# Patient Record
Sex: Female | Born: 1970 | Race: White | Hispanic: No | Marital: Single | Smoking: Never smoker
Health system: Southern US, Community
[De-identification: ages and names within clinical notes are randomized; demographics above are authoritative.]

## PROBLEM LIST (undated history)

## (undated) DIAGNOSIS — D649 Anemia, unspecified: Secondary | ICD-10-CM

## (undated) DIAGNOSIS — J302 Other seasonal allergic rhinitis: Secondary | ICD-10-CM

## (undated) HISTORY — PX: OTHER SURGICAL HISTORY: SHX169

## (undated) HISTORY — DX: Anemia, unspecified: D64.9

---

## 2002-02-13 HISTORY — PX: THYROID SURGERY: SHX805

## 2017-07-20 ENCOUNTER — Observation Stay (HOSPITAL_COMMUNITY): Payer: BLUE CROSS/BLUE SHIELD

## 2017-07-20 ENCOUNTER — Emergency Department (HOSPITAL_COMMUNITY): Payer: BLUE CROSS/BLUE SHIELD

## 2017-07-20 ENCOUNTER — Other Ambulatory Visit: Payer: Self-pay

## 2017-07-20 ENCOUNTER — Observation Stay (HOSPITAL_COMMUNITY)
Admission: EM | Admit: 2017-07-20 | Discharge: 2017-07-21 | Disposition: A | Discharge status: Home / Self Care | Payer: BLUE CROSS/BLUE SHIELD | Attending: Internal Medicine | Admitting: Internal Medicine

## 2017-07-20 ENCOUNTER — Encounter (HOSPITAL_COMMUNITY): Payer: Self-pay | Admitting: *Deleted

## 2017-07-20 DIAGNOSIS — D649 Anemia, unspecified: Secondary | ICD-10-CM

## 2017-07-20 DIAGNOSIS — Z885 Allergy status to narcotic agent status: Secondary | ICD-10-CM | POA: Diagnosis not present

## 2017-07-20 DIAGNOSIS — N920 Excessive and frequent menstruation with regular cycle: Secondary | ICD-10-CM | POA: Insufficient documentation

## 2017-07-20 DIAGNOSIS — D509 Iron deficiency anemia, unspecified: Secondary | ICD-10-CM | POA: Insufficient documentation

## 2017-07-20 DIAGNOSIS — N800 Endometriosis of uterus: Secondary | ICD-10-CM | POA: Insufficient documentation

## 2017-07-20 DIAGNOSIS — I472 Ventricular tachycardia: Secondary | ICD-10-CM | POA: Insufficient documentation

## 2017-07-20 DIAGNOSIS — R0602 Shortness of breath: Secondary | ICD-10-CM | POA: Insufficient documentation

## 2017-07-20 DIAGNOSIS — Z7901 Long term (current) use of anticoagulants: Secondary | ICD-10-CM | POA: Insufficient documentation

## 2017-07-20 DIAGNOSIS — E611 Iron deficiency: Secondary | ICD-10-CM | POA: Diagnosis present

## 2017-07-20 DIAGNOSIS — I2699 Other pulmonary embolism without acute cor pulmonale: Principal | ICD-10-CM | POA: Insufficient documentation

## 2017-07-20 DIAGNOSIS — Z882 Allergy status to sulfonamides status: Secondary | ICD-10-CM | POA: Insufficient documentation

## 2017-07-20 DIAGNOSIS — Z79899 Other long term (current) drug therapy: Secondary | ICD-10-CM | POA: Diagnosis not present

## 2017-07-20 DIAGNOSIS — R911 Solitary pulmonary nodule: Secondary | ICD-10-CM | POA: Diagnosis present

## 2017-07-20 DIAGNOSIS — R109 Unspecified abdominal pain: Secondary | ICD-10-CM

## 2017-07-20 HISTORY — DX: Other seasonal allergic rhinitis: J30.2

## 2017-07-20 LAB — I-STAT TROPONIN, ED: TROPONIN I, POC: 0.02 ng/mL (ref 0.00–0.08)

## 2017-07-20 LAB — PROTIME-INR
INR: 1.15
PROTHROMBIN TIME: 14.6 s (ref 11.4–15.2)

## 2017-07-20 LAB — BASIC METABOLIC PANEL
Anion gap: 10 (ref 5–15)
BUN: 8 mg/dL (ref 6–20)
CO2: 23 mmol/L (ref 22–32)
Calcium: 8.7 mg/dL — ABNORMAL LOW (ref 8.9–10.3)
Chloride: 104 mmol/L (ref 101–111)
Creatinine, Ser: 1.05 mg/dL — ABNORMAL HIGH (ref 0.44–1.00)
GFR calc Af Amer: >60 mL/min (ref >60)
GFR calc non Af Amer: >60 mL/min (ref >60)
Glucose, Bld: 110 mg/dL — ABNORMAL HIGH (ref 65–99)
Potassium: 4.1 mmol/L (ref 3.5–5.1)
Sodium: 137 mmol/L (ref 135–145)

## 2017-07-20 LAB — CBC
HCT: 17.5 % — ABNORMAL LOW (ref 36.0–46.0)
HCT: 23.8 % — ABNORMAL LOW (ref 36.0–46.0)
HEMOGLOBIN: 7.3 g/dL — AB (ref 12.0–15.0)
Hemoglobin: 5.1 g/dL — CL (ref 12.0–15.0)
MCH: 21.1 pg — ABNORMAL LOW (ref 26.0–34.0)
MCH: 23.3 pg — ABNORMAL LOW (ref 26.0–34.0)
MCHC: 29.1 g/dL — ABNORMAL LOW (ref 30.0–36.0)
MCHC: 30.7 g/dL (ref 30.0–36.0)
MCV: 72.3 fL — AB (ref 78.0–100.0)
MCV: 76 fL — ABNORMAL LOW (ref 78.0–100.0)
PLATELETS: 315 10*3/uL (ref 150–400)
Platelets: 261 10*3/uL (ref 150–400)
RBC: 2.42 MIL/uL — ABNORMAL LOW (ref 3.87–5.11)
RBC: 3.13 MIL/uL — AB (ref 3.87–5.11)
RDW: 17.2 % — AB (ref 11.5–15.5)
RDW: 17.8 % — ABNORMAL HIGH (ref 11.5–15.5)
WBC: 12.6 10*3/uL — AB (ref 4.0–10.5)
WBC: 12.1 10*3/uL — AB (ref 4.0–10.5)

## 2017-07-20 LAB — VITAMIN B12: Vitamin B-12: 900 pg/mL (ref 180–914)

## 2017-07-20 LAB — PREPARE RBC (CROSSMATCH)

## 2017-07-20 LAB — HEPATIC FUNCTION PANEL
ALBUMIN: 3.1 g/dL — AB (ref 3.5–5.0)
ALT: 18 U/L (ref 14–54)
AST: 23 U/L (ref 15–41)
Alkaline Phosphatase: 97 U/L (ref 38–126)
BILIRUBIN TOTAL: 0.5 mg/dL (ref 0.3–1.2)
Bilirubin, Direct: <0.1 mg/dL — ABNORMAL LOW (ref 0.1–0.5)
Total Protein: 7 g/dL (ref 6.5–8.1)

## 2017-07-20 LAB — POC OCCULT BLOOD, ED: Fecal Occult Bld: NEGATIVE

## 2017-07-20 LAB — ABO/RH: ABO/RH(D): A POS

## 2017-07-20 LAB — IRON AND TIBC
IRON: 9 ug/dL — AB (ref 28–170)
Saturation Ratios: 2 % — ABNORMAL LOW (ref 10.4–31.8)
TIBC: 472 ug/dL — AB (ref 250–450)
UIBC: 463 ug/dL

## 2017-07-20 LAB — FERRITIN: FERRITIN: 13 ng/mL (ref 11–307)

## 2017-07-20 LAB — RETICULOCYTES
RBC.: 2.41 MIL/uL — AB (ref 3.87–5.11)
RETIC COUNT ABSOLUTE: 36.2 10*3/uL (ref 19.0–186.0)
Retic Ct Pct: 1.5 % (ref 0.4–3.1)

## 2017-07-20 LAB — HCG, QUANTITATIVE, PREGNANCY: HCG, BETA CHAIN, QUANT, S: 71 m[IU]/mL — AB (ref <5)

## 2017-07-20 LAB — PREGNANCY, URINE: Preg Test, Ur: NEGATIVE

## 2017-07-20 LAB — TSH: TSH: 1.057 u[IU]/mL (ref 0.350–4.500)

## 2017-07-20 LAB — D-DIMER, QUANTITATIVE: D-Dimer, Quant: 5.15 ug/mL-FEU — ABNORMAL HIGH (ref 0.00–0.50)

## 2017-07-20 LAB — FOLATE: Folate: 28.2 ng/mL (ref >5.9)

## 2017-07-20 MED ORDER — SODIUM CHLORIDE 0.9 % IV SOLN: Freq: Once | INTRAVENOUS | Status: DC

## 2017-07-20 MED ORDER — SODIUM CHLORIDE 0.9 % IV SOLN
250.0000 mg | Freq: Once | INTRAVENOUS | Status: AC
  Administered 2017-07-20: 250 mg via INTRAVENOUS
  Filled 2017-07-20: qty 5

## 2017-07-20 MED ORDER — SODIUM CHLORIDE 0.9 % IV BOLUS
1000.0000 mL | Freq: Once | INTRAVENOUS | Status: AC
  Administered 2017-07-20: 1000 mL via INTRAVENOUS

## 2017-07-20 MED ORDER — ACETAMINOPHEN 650 MG RE SUPP: 650.0000 mg | Freq: Four times a day (QID) | RECTAL | Status: DC | PRN

## 2017-07-20 MED ORDER — IOPAMIDOL (ISOVUE-370) INJECTION 76%
INTRAVENOUS | Status: AC
  Administered 2017-07-20: 80 mL via INTRAVENOUS
  Filled 2017-07-20: qty 100

## 2017-07-20 MED ORDER — LORATADINE 10 MG PO TABS
10.0000 mg | ORAL_TABLET | Freq: Every day | ORAL | Status: DC
  Administered 2017-07-21: 10 mg via ORAL
  Filled 2017-07-20: qty 1

## 2017-07-20 MED ORDER — ALPRAZOLAM 0.25 MG PO TABS
0.2500 mg | ORAL_TABLET | Freq: Three times a day (TID) | ORAL | Status: DC | PRN
  Administered 2017-07-21: 0.25 mg via ORAL
  Filled 2017-07-20: qty 1

## 2017-07-20 MED ORDER — LORAZEPAM 2 MG/ML IJ SOLN
0.5000 mg | Freq: Once | INTRAMUSCULAR | Status: AC
  Administered 2017-07-20: 0.5 mg via INTRAVENOUS
  Filled 2017-07-20: qty 1

## 2017-07-20 MED ORDER — SODIUM CHLORIDE 0.9 % IV SOLN
Freq: Once | INTRAVENOUS | Status: AC
  Administered 2017-07-20: 18:00:00 via INTRAVENOUS

## 2017-07-20 MED ORDER — SODIUM CHLORIDE 0.9% FLUSH
3.0000 mL | Freq: Two times a day (BID) | INTRAVENOUS | Status: DC
  Administered 2017-07-20 – 2017-07-21 (×2): 3 mL via INTRAVENOUS

## 2017-07-20 MED ORDER — ENOXAPARIN SODIUM 80 MG/0.8ML ~~LOC~~ SOLN
80.0000 mg | Freq: Two times a day (BID) | SUBCUTANEOUS | Status: DC
  Administered 2017-07-20 – 2017-07-21 (×4): 80 mg via SUBCUTANEOUS
  Filled 2017-07-20 (×5): qty 0.8

## 2017-07-20 MED ORDER — ONDANSETRON HCL 4 MG PO TABS: 4.0000 mg | ORAL_TABLET | Freq: Four times a day (QID) | ORAL | Status: DC | PRN

## 2017-07-20 MED ORDER — ONDANSETRON HCL 4 MG/2ML IJ SOLN: 4.0000 mg | Freq: Four times a day (QID) | INTRAMUSCULAR | Status: DC | PRN

## 2017-07-20 MED ORDER — GADOBENATE DIMEGLUMINE 529 MG/ML IV SOLN
18.0000 mL | Freq: Once | INTRAVENOUS | Status: AC
  Administered 2017-07-20: 18 mL via INTRAVENOUS

## 2017-07-20 MED ORDER — IRON DEXTRAN 50 MG/ML IJ SOLN
25.0000 mg | Freq: Once | INTRAMUSCULAR | Status: AC
  Administered 2017-07-20: 25 mg via INTRAVENOUS
  Filled 2017-07-20: qty 0.5

## 2017-07-20 MED ORDER — POLYSACCHARIDE IRON COMPLEX 150 MG PO CAPS: 150.0000 mg | ORAL_CAPSULE | Freq: Every day | ORAL | Status: DC

## 2017-07-20 MED ORDER — POLYSACCHARIDE IRON COMPLEX 150 MG PO CAPS
150.0000 mg | ORAL_CAPSULE | Freq: Every day | ORAL | Status: DC
  Administered 2017-07-21: 150 mg via ORAL
  Filled 2017-07-20: qty 1

## 2017-07-20 MED ORDER — SODIUM CHLORIDE 0.9 % IV SOLN
10.0000 mL/h | Freq: Once | INTRAVENOUS | Status: AC
  Administered 2017-07-20: 10 mL/h via INTRAVENOUS

## 2017-07-20 MED ORDER — ACETAMINOPHEN 325 MG PO TABS
650.0000 mg | ORAL_TABLET | Freq: Four times a day (QID) | ORAL | Status: DC | PRN
  Administered 2017-07-21: 650 mg via ORAL
  Filled 2017-07-20: qty 2

## 2017-07-20 NOTE — ED Notes (Signed)
Patient transported to Ultrasound 

## 2017-07-20 NOTE — ED Provider Notes (Signed)
[facility] EMERGENCY DEPARTMENT Provider Note   CSN: [id number] Arrival date & time: 07/20/17  0117     History   Chief Complaint Chief Complaint  Patient presents with  . Shortness of Breath  . Chest Pain    HPI Brianna Wiley is a 47 y.o. female.  47 year old female presents to the emergency department for evaluation of shortness of breath.  Patient states that she has had shortness of breath for the past 10 days.  She noticed symptoms after flying to [place] with her mother.  Shortness of breath was worsened by exertion and prolonged speech.  Patient also felt increasingly fatigued.  She would experience palpitations with exertion or walking.  The patient initially attributed her symptoms to poor altitude adjustment.  She had a dry, nonproductive cough and noted some preceding upper respiratory-type symptoms before her travels.  She was evaluated at urgent care and diagnosed with bronchitis.  The patient took a 5-day course of prednisone as well as Augmentin.  This provided no relief to her dyspnea.  She proceeded to drive cross[place]country with her mother and, given worsening and persistent symptoms upon returning home, decided to seek evaluation in the ED.  She denies any known fevers, melena, hematochezia, increased bleeding or bruising.  She is not on chronic anticoagulation.  No history of prior transfusions for anemia.  She does have a history of menorrhagia, though this has never been problematic.  No associated abdominal pain.  The history is provided by the patient and a relative. No language interpreter was used.    History reviewed. No pertinent past medical history.  There are no active problems to display for this patient.   History reviewed. No pertinent surgical history.   OB History   None      Home Medications    Prior to Admission medications   Medication Sig Start Date End Date Taking? Authorizing Provider  cetirizine (ZYRTEC) 10 MG tablet  Take 10 mg by mouth every morning.   Yes [provider]    Family History No family history on file.  Social History Social History   Tobacco Use  . Smoking status: Never Smoker  . Smokeless tobacco: Never Used  Substance Use Topics  . Alcohol use: Never    Frequency: Never  . Drug use: Never     Allergies   Codeine and Sulfa antibiotics   Review of Systems Review of Systems Ten systems reviewed and are negative for acute change, except as noted in the HPI.    Physical Exam Updated Vital Signs BP 140/85   Pulse (!) 104   Temp 98.1 F (36.7 C)   Resp (!) 27   Ht 5\' 6"  (1.676 m)   Wt 81.6 kg (180 lb)   LMP 07/04/2017   SpO2 100%   BMI 29.05 kg/m   Physical Exam  Constitutional: She is oriented to person, place, and time. She appears well-developed and well-nourished. No distress.  Pleasant, nontoxic.  HENT:  Head: Normocephalic and atraumatic.  Eyes: EOM are normal. No scleral icterus.  Pale conjunctiva  Neck: Normal range of motion.  Cardiovascular: Regular rhythm and intact distal pulses.  Tachycardia  Pulmonary/Chest: Effort normal. No stridor. No respiratory distress. She has no wheezes. She has no rales.  Lungs CTAB. Dyspneic with prolonged speech. SpO2 96-98% on room air.  Genitourinary:  Genitourinary Comments: Normal rectal tone. No melena or BRBPR on chaperoned DRE.  Musculoskeletal: Normal range of motion.  Neurological: She is alert and  oriented to person, place, and time. She exhibits normal muscle tone. Coordination normal.  GCS 15. Patient moving all extremities.  Skin: Skin is warm and dry. No rash noted. She is not diaphoretic. No erythema. No pallor.  Psychiatric: She has a normal mood and affect. Her behavior is normal.  Nursing note and vitals reviewed.    ED Treatments / Results  Labs (all labs ordered are listed, but only abnormal results are displayed) Labs Reviewed  BASIC METABOLIC PANEL - Abnormal; Notable for the  following components:      Result Value   Glucose, Bld 110 (*)    Creatinine, Ser 1.05 (*)    Calcium 8.7 (*)    All other components within normal limits  CBC - Abnormal; Notable for the following components:   WBC 12.6 (*)    RBC 2.42 (*)    Hemoglobin 5.1 (*)    HCT 17.5 (*)    MCV 72.3 (*)    MCH 21.1 (*)    MCHC 29.1 (*)    RDW 17.2 (*)    All other components within normal limits  HCG, QUANTITATIVE, PREGNANCY - Abnormal; Notable for the following components:   hCG, Beta Chain, Quant, S 71 (*)    All other components within normal limits  D-DIMER, QUANTITATIVE (NOT AT [facility]) - Abnormal; Notable for the following components:   D-Dimer, Quant 5.15 (*)    All other components within normal limits  IRON AND TIBC - Abnormal; Notable for the following components:   Iron 9 (*)    TIBC 472 (*)    Saturation Ratios 2 (*)    All other components within normal limits  RETICULOCYTES - Abnormal; Notable for the following components:   RBC. 2.41 (*)    All other components within normal limits  PROTIME-INR  PREGNANCY, URINE  VITAMIN B12  FERRITIN  FOLATE  I-STAT TROPONIN, ED  POC OCCULT BLOOD, ED  TYPE AND SCREEN  ABO/RH  PREPARE RBC (CROSSMATCH)    EKG None  Radiology Dg Chest 2 View  Result Date: 07/20/2017 CLINICAL DATA:  Acute onset of generalized chest pain and shortness of breath. EXAM: CHEST - 2 VIEW COMPARISON:  None. FINDINGS: The lungs are well-aerated and clear. There is no evidence of focal opacification, pleural effusion or pneumothorax. The heart is borderline normal in size. No acute osseous abnormalities are seen. IMPRESSION: No acute cardiopulmonary process seen. Electronically Signed   By: Garald Balding M.D.   On: 07/20/2017 02:43    Procedures Procedures (including critical care time)  Medications Ordered in ED Medications  0.9 %  sodium chloride infusion (has no administration in time range)  sodium chloride 0.9 % bolus 1,000 mL (0 mLs Intravenous  Stopped 07/20/17 0329)    CRITICAL CARE Performed by: Antonietta Breach   Total critical care time: 45 minutes  Critical care time was exclusive of separately billable procedures and treating other patients.  Critical care was necessary to treat or prevent imminent or life-threatening deterioration.  Critical care was time spent personally by me on the following activities: development of treatment plan with patient and/or surrogate as well as nursing, discussions with consultants, evaluation of patient's response to treatment, examination of patient, obtaining history from patient or surrogate, ordering and performing treatments and interventions, ordering and review of laboratory studies, ordering and review of radiographic studies, pulse oximetry and re-evaluation of patient's condition.   Initial Impression / Assessment and Plan / ED Course  I have reviewed the triage vital  signs and the nursing notes.  Pertinent labs & imaging results that were available during my care of the patient were reviewed by me and considered in my medical decision making (see chart for details).     47 year old female presents to the emergency department for palpitations and dyspnea on exertion.  Her symptoms are likely secondary to symptomatic anemia given hemoglobin of 5.1.  Low MCV favors chronic anemia.  She is hemoccult negative and denies any melena or hematochezia.  No increased bruising or bleeding.  She does report a history of menorrhagia, but denies any prior issues with anemia secondary to this.  A d-dimer was ordered in triage, found to be 5.15.  While PE remains low on differential given that anemia fits clinical picture, will continue with CTA.  Discussed risks of radiation with the patient who verbalizes understanding.  She is agreeable to proceed with CT scan.  Patient to be admitted by [facility] hospitalist service.  She has been ordered to receive 2 units of PRBCs.   Final Clinical Impressions(s) /  ED Diagnoses   Final diagnoses:  Symptomatic anemia    ED Discharge Orders    None       Antonietta Breach, PA-C 07/20/17 1324    Veryl Speak, MD 07/20/17 [phone]

## 2017-07-20 NOTE — Progress Notes (Addendum)
Transvaginal/pelvic ultrasound revealed heterogeneous echotexture throughout the uterus which can be seen with adenomyosis.  There was also a large complex cystic mass within the right ovary measuring up to 7.9 cm with mild mural nodularity.  If not surgically excised this could be further characterized by MRI.  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  Discussed with patient.  No prior history of ovarian cysts.  Discussed need with patient to obtain an MRI to better characterize this lesion. MR pelvis ordered.  1647: Post transfusion Hgb up to 7.3- will transfuse 2 more units PRBCs and rpt CBC post transfusion. MR pelvis in process.  1705: With patient's permission mother and sister at bedside updated on MR results which are highly suspicious for ovarian malignancy.  Patient and family aware that GYN consultation indicated but likely would not take place until tomorrow on 6/8.  Patient and family verbalized understanding of MR results and need for formal gynecological evaluation and likely eventual surgical intervention.  1722: Discussed with on-call GYN Dr. Elonda Husky.  Given patient's high acuity and presentation with bilateral PE and symptomatic anemia with a new diagnosis of ovarian cancer patient would best be served by transferring to tertiary facility.  [facility] oncology GYN was recommended.  Will leave signout for oncoming daytime physician and recommend they contact [facility] and/or other tertiary GYN oncology service.   Erin Hearing, ANP

## 2017-07-20 NOTE — Progress Notes (Signed)
ANTICOAGULATION CONSULT NOTE - Initial Consult  Pharmacy Consult for lovenox Indication: pulmonary embolus  Allergies  Allergen Reactions  . Codeine Anaphylaxis  . Sulfa Antibiotics Hives    Patient Measurements: Height: 5\' 6"  (167.6 cm) Weight: 180 lb (81.6 kg) IBW/kg (Calculated) : 59.3  Vital Signs: Temp: 99.6 F (37.6 C) (06/07 0824) Temp Source: Oral (06/07 0824) BP: 143/83 (06/07 0800) Pulse Rate: 100 (06/07 0824)  Labs: Recent Labs    07/20/17 0201 07/20/17 0252  HGB 5.1*  --   HCT 17.5*  --   PLT 315  --   LABPROT  --  14.6  INR  --  1.15  CREATININE 1.05*  --     Estimated Creatinine Clearance: 71.3 mL/min (A) (by C-G formula based on SCr of 1.05 mg/dL (H)).  Assessment: 8 yof presented to the ED with SOB and CP after recent travel. Found to have a bilateral PE without right heart strain. To start lovenox treatment. Of note, pt with significant anemia with Hgb of 5.1. Discussed with provider that pt has no bleeding. Iron level is low. Transfusing patient and will require close monitoring.   Goal of Therapy:  Anti-Xa level 0.6-1 units/ml 4hrs after LMWH dose given Monitor platelets by anticoagulation protocol: Yes   Plan:  Lovenox 80mg  SQ Q12H F/u CBC Monitor renal function and signs/symptoms of bleeding F/u transition to oral anticoagulation>>likely NOAC  Brianca Fortenberry, Rande Lawman 07/20/2017,8:25 AM

## 2017-07-20 NOTE — ED Notes (Signed)
Pt back from MRI. Tech bringing her upstairs to new room.

## 2017-07-20 NOTE — Care Management (Signed)
Benefits check in for:  1. XARELTO 15 MG BID  2. XARELTO 20 MG DAILY  3. ELIQUIS 5 MG BID  4. ELIQUIS 2.5 MG BID   Will update with results as it becomes available.

## 2017-07-20 NOTE — ED Notes (Signed)
Pt remains in MRI 

## 2017-07-20 NOTE — Plan of Care (Signed)
Discussed with Antonietta Breach, PA-C  Brianna Wiley is a 47 year old female who presented by EMS for chest pain and SOB for several days after recent trip to [place] 10 days ago.  VS: Temperature afebrile, pulse 99-1 10, respiration 20-35, blood pressure stable, and O2 saturation maintained on room air.  Labs: WBC 12.6, hemoglobin 5.1( with low MCV and MCH),creatinine 1.5, troponin negative, d-dimer 5.15.  Stool guaiac negative.  Chest x-ray was otherwise clear.  Beta-hCG was noted to be positive, but urine pregnancy screen was negative.  CT angiogram of the chest is pending.  Patient was typed and screened for blood products in order to be transfused 2 units of packed red blood cells.  TRH called to admit.  Accepted to a telemetry bed.

## 2017-07-20 NOTE — H&P (Addendum)
Patient is currently on room air, vital signs are stable. She has anemia (hgb 5), no acute bleed currently, she has bilateral PE. She is getting prbc transfusion, started on anticoagulation. Echo/venous doppler/transvagianl US ordered. She is advised to follow up with gyn and pcp closely.  I have directly reviewed the clinical findings, lab results and imaging studies. I have interviewed and examined the patient and agree with the documentation and management as recorded by the Physician extender.  Florencia Reasons M.D PhD on 07/20/2017 at 9:59 AM  [facility] Office  [phone]      History and Physical    Brianna Wiley BJY:[id number] DOB: 1970-05-24 DOA: 07/20/2017  **Will admit patient based on the expectation that the patient will need hospitalization/ hospital care that crosses at least 2 midnights  PCP: [facility]   Attending physician: Florencia Reasons  Patient coming from/Resides with: Private residence  Chief Complaint: Shortness of breath and palpitations  HPI: Brianna Wiley is a 47 y.o. female with medical history significant for seasonal allergies and remote resection of left benign thigh nodule arm nodule.  Patient reports that in March she had "flu" and was treated with prednisone and antibiotics.  She recovered from that.  No extensive bed rest.  Several weeks before a recent trip to [place] she felt like she had cold-like symptoms although had a dry nonproductive cough and no fevers or chills.  She developed more progressive shortness of breath several days before the trip but did not feel the symptoms are significant enough to cancel her trip.  While in [place] she developed tachypalpitations and worsening dyspnea on exertion and fatigue.  Resented to a local urgent care and was diagnosed with bronchitis and altitude sickness.  She was treated once again with antibiotics and prednisone without any significant resolution in her symptoms.   Initial travel to [facility] involved flying by aircraft 10 days ago.  Upon presentation to the ER the patient was afebrile, mildly tachycardic but normotensive.  She was found to have a hemoglobin of 5.1 with low MCV.  Patient does report history of heavy periods for the first day and a half of her cycles which is a chronic occurrence.  No GI bleeding symptoms.  EDP has treated as symptomatic anemia and has started 2 units of packed red blood cells.  Subsequent anemia panel positive for significantly low iron at 9.  Because of her history of travel and presenting symptoms a d-dimer was obtained in triage and was found to be elevated at 5.15.  After the patient was handed off to [facility] team for admission CT results returned and were positive for bilateral PE.  On exam patient does not have any significant lower extremity edema although right calf appears slightly larger than left calf.  Continues to have a dry nonproductive cough, tachycardia and shortness of breath with exertion.  ED Course:  Vital Signs: BP (!) 146/70   Pulse 98   Temp 98.9 F (37.2 C) (Oral)   Resp (!) 23   Ht 5\' 6"  (1.676 m)   Wt 81.6 kg (180 lb)   LMP 07/04/2017   SpO2 96%   BMI 29.05 kg/m  CXR: Neg CTA chest: Bilateral pulmonary emboli is somewhat more extensive on the right than the left but extending into multiple upper and lower lobe pulmonary arterial vessels bilaterally.  Incidental finding of a 7 x 5 mm nodular opacity in the apical segment of the right upper lobe with a noncontrast  CT recommended at 6 to 12 months Lab data: Sodium 137, potassium 4.1, chloride 104, CO2 23, glucose 110, BUN 8, creatinine 1.05, anion gap 10, troponin normal, LFTs normal including normal total bilirubin, iron 9, saturation 2%, U IBC 463, TIBC 472, ferritin 13, folate 28.2, vitamin B12 900, hCG beta 71 but urine pregnancy negative, white count 12,600 differential not obtained, MCV 72.3, hemoglobin 5.1, platelets 315,000, d-dimer 5.15,  coags normal FOB negative Medications and treatments: NS bolus x1 L  Review of Systems:  In addition to the HPI above,  No Fever-chills, myalgias or other constitutional symptoms No Headache, changes with Vision or hearing, new focal weakness, tingling, numbness in any extremity, dizziness, dysarthria or word finding difficulty, gait disturbance or imbalance, tremors or seizure activity No problems swallowing food or Liquids, indigestion/reflux, choking or coughing while eating, abdominal pain with or after eating No Abdominal pain, N/V, melena,hematochezia, dark tarry stools, constipation No dysuria, malodorous urine, hematuria or flank pain No new skin rashes, lesions, masses or bruises, No new joint pains, aches, swelling or redness No recent unintentional weight gain or loss No polyuria, polydypsia or polyphagia   Past Medical History:  Diagnosis Date  . Seasonal allergies     Past Surgical History:  Procedure Laterality Date  . THYROID SURGERY Left 2004   removal of benign nodule    Social History   Socioeconomic History  . Marital status: Single    Spouse name: Not on file  . Number of children: Not on file  . Years of education: Not on file  . Highest education level: Not on file  Occupational History  . Not on file  Social Needs  . Financial resource strain: Not on file  . Food insecurity:    Worry: Not on file    Inability: Not on file  . Transportation needs:    Medical: Not on file    Non-medical: Not on file  Tobacco Use  . Smoking status: Never Smoker  . Smokeless tobacco: Never Used  Substance and Sexual Activity  . Alcohol use: Never    Frequency: Never  . Drug use: Never  . Sexual activity: Not on file  Lifestyle  . Physical activity:    Days per week: Not on file    Minutes per session: Not on file  . Stress: Not on file  Relationships  . Social connections:    Talks on phone: Not on file    Gets together: Not on file    Attends religious  service: Not on file    Active member of club or organization: Not on file    Attends meetings of clubs or organizations: Not on file    Relationship status: Not on file  . Intimate partner violence:    Fear of current or ex partner: Not on file    Emotionally abused: Not on file    Physically abused: Not on file    Forced sexual activity: Not on file  Other Topics Concern  . Not on file  Social History Narrative  . Not on file    Mobility: Independent Work history: Works two part-time jobs.  Job #1 is at [employer] and is essentially sedentary.  Job #2 is at [employer] and involves walking and standing on her feet 100% of the job   Allergies  Allergen Reactions  . Codeine Anaphylaxis  . Sulfa Antibiotics Hives    Family History  Problem Relation Age of Onset  . Iron deficiency Mother   .  Anemia Sister   . Iron deficiency Sister   . Goiter Sister      Prior to Admission medications   Medication Sig Start Date End Date Taking? Authorizing Provider  cetirizine (ZYRTEC) 10 MG tablet Take 10 mg by mouth every morning.   Yes [provider]    Physical Exam: Vitals:   07/20/17 0824 07/20/17 0850 07/20/17 0855 07/20/17 0912  BP:  (!) 147/76 (!) 141/64 (!) 146/70  Pulse: 100 (!) 102 (!) 103 98  Resp: (!) 24 (!) 24 (!) 26 (!) 23  Temp: 99.6 F (37.6 C) 99.6 F (37.6 C) 99.2 F (37.3 C) 98.9 F (37.2 C)  TempSrc: Oral Oral Oral Oral  SpO2: 96% 96% 96% 96%  Weight:      Height:          Constitutional: NAD, calm, comfortable-overall pale in appearance Eyes: PERRL, lids and sclera are normal in appearance, bilateral conjunctivae pale ENMT: Mucous membranes are moist. Posterior pharynx clear of any exudate or lesions.Normal dentition.  Neck: normal, supple, no masses, no thyromegaly or nodules Respiratory: clear to auscultation bilaterally, no wheezing, no crackles. Normal respiratory effort. No accessory muscle use.  Dry nonproductive cough  witnessed. Cardiovascular: Regular rhythm with subtle resting tachycardia ventricular rate 10 2-1 05, no murmurs / rubs / gallops. No pronounced lower extremity edema the right calf appears subtly more enlarged than left. 2+ pedal pulses. No carotid bruits.  Abdomen: no tenderness, no masses palpated. No hepatosplenomegaly. Bowel sounds positive.  Musculoskeletal: no clubbing / cyanosis. No joint deformity upper and lower extremities. Good ROM, no contractures. Normal muscle tone.  Skin: no rashes, lesions, ulcers. No induration Neurologic: CN 2-12 grossly intact. Sensation intact, DTR normal. Strength 5/5 x all 4 extremities.  Psychiatric: Normal judgment and insight. Alert and oriented x 3. Normal mood.    Labs on Admission: I have personally reviewed following labs and imaging studies  CBC: Recent Labs  Lab 07/20/17 0201  WBC 12.6*  HGB 5.1*  HCT 17.5*  MCV 72.3*  PLT 101   Basic Metabolic Panel: Recent Labs  Lab 07/20/17 0201  NA 137  K 4.1  CL 104  CO2 23  GLUCOSE 110*  BUN 8  CREATININE 1.05*  CALCIUM 8.7*   GFR: Estimated Creatinine Clearance: 71.3 mL/min (A) (by C-G formula based on SCr of 1.05 mg/dL (H)). Liver Function Tests: Recent Labs  Lab 07/20/17 0750  AST 23  ALT 18  ALKPHOS 97  BILITOT 0.5  PROT 7.0  ALBUMIN 3.1*   No results for input(s): LIPASE, AMYLASE in the last 168 hours. No results for input(s): AMMONIA in the last 168 hours. Coagulation Profile: Recent Labs  Lab 07/20/17 0252  INR 1.15   Cardiac Enzymes: No results for input(s): CKTOTAL, CKMB, CKMBINDEX, TROPONINI in the last 168 hours. BNP (last 3 results) No results for input(s): PROBNP in the last 8760 hours. HbA1C: No results for input(s): HGBA1C in the last 72 hours. CBG: No results for input(s): GLUCAP in the last 168 hours. Lipid Profile: No results for input(s): CHOL, HDL, LDLCALC, TRIG, CHOLHDL, LDLDIRECT in the last 72 hours. Thyroid Function Tests: No results for  input(s): TSH, T4TOTAL, FREET4, T3FREE, THYROIDAB in the last 72 hours. Anemia Panel: Recent Labs    07/20/17 0400  VITAMINB12 900  FOLATE 28.2  FERRITIN 13  TIBC 472*  IRON 9*  RETICCTPCT 1.5   Urine analysis: No results found for: COLORURINE, APPEARANCEUR, LABSPEC, PHURINE, GLUCOSEU, HGBUR, BILIRUBINUR, KETONESUR, PROTEINUR, UROBILINOGEN, NITRITE,  LEUKOCYTESUR Sepsis Labs: @LABRCNTIP (procalcitonin:4,lacticidven:4) )No results found for this or any previous visit (from the past 240 hour(s)).   Radiological Exams on Admission: Dg Chest 2 View  Result Date: 07/20/2017 CLINICAL DATA:  Acute onset of generalized chest pain and shortness of breath. EXAM: CHEST - 2 VIEW COMPARISON:  None. FINDINGS: The lungs are well-aerated and clear. There is no evidence of focal opacification, pleural effusion or pneumothorax. The heart is borderline normal in size. No acute osseous abnormalities are seen. IMPRESSION: No acute cardiopulmonary process seen. Electronically Signed   By: Garald Balding M.D.   On: 07/20/2017 02:43   Ct Angio Chest Pe W And/or Wo Contrast  Result Date: 07/20/2017 CLINICAL DATA:  Shortness of breath EXAM: CT ANGIOGRAPHY CHEST WITH CONTRAST TECHNIQUE: Multidetector CT imaging of the chest was performed using the standard protocol during bolus administration of intravenous contrast. Multiplanar CT image reconstructions and MIPs were obtained to evaluate the vascular anatomy. CONTRAST:  80 mL ISOVUE-370 IOPAMIDOL (ISOVUE-370) INJECTION 76% COMPARISON:  Chest radiograph July 20, 2017 FINDINGS: Cardiovascular: There are pulmonary emboli arising from the distal most aspect of the right main pulmonary artery extending into multiple right upper and lower lobe branches. There also pulmonary emboli in several left upper and lower lobe pulmonary artery branches. The right ventricle to left ventricle diameter ratio is less than 0.9, not consistent with right heart strain. There is no thoracic  aortic aneurysm or dissection. The visualized great vessels appear normal. There is no pericardial effusion or pericardial thickening. Mediastinum/Nodes: Thyroid appears unremarkable. There is no appreciable thoracic adenopathy. No esophageal lesions are evident. Lungs/Pleura: There is mild atelectatic change in the left base. No pulmonary infarcts are evident. No consolidation noted. In the apical segment of the right upper lobe, there is a nodular opacity measuring 7 x 5 mm, best seen on axial slice 24 series 6. There is no pleural effusion or pleural thickening evident. Upper Abdomen: Visualized upper abdominal structures appear normal. Musculoskeletal: There are no blastic or lytic bone lesions. No chest wall lesions evident. Review of the MIP images confirms the above findings. IMPRESSION: 1. Bilateral pulmonary emboli, somewhat or extensive on the right than on the left but extending into multiple upper and lower lobe pulmonary arterial vessels bilaterally. No demonstrable right heart strain. 2.  No thoracic aortic aneurysm or dissection. 3. 7 x 5 mm nodular opacity in the apical segment right upper lobe. Non-contrast chest CT at 6-12 months is recommended. If the nodule is stable at time of repeat CT, then future CT at 18-24 months (from today's scan) is considered optional for low-risk patients, but is recommended for high-risk patients. This recommendation follows the consensus statement: Guidelines for Management of Incidental Pulmonary Nodules Detected on CT Images: From the Fleischner Society 2017; Radiology 2017; 284:228-243. 4.  No lung edema or consolidation.  Slight atelectasis left base. 5.  No appreciable thoracic adenopathy. Critical Value/emergent results were called by telephone at the time of interpretation on 07/20/2017 at 7:54 am to Dr. Jola Schmidt , who verbally acknowledged these results. Electronically Signed   By: Lowella Grip III M.D.   On: 07/20/2017 07:54    EKG: (Independently  reviewed) sinus tachycardia ventricular rate 105 bpm, QTC 459 ms, nonspecific ST segment downsloping in leads II and aVF, normal R wave rotation, no acute ischemic changes  Assessment/Plan Principal Problem:   Bilateral pulmonary embolism  -Patient presents with progressive dyspnea, palpitations and dry nonproductive cough that began several weeks before plane travel to [place],  [place] but worsened after arrival and has persisted since return to [place].  D-dimer elevated and CTA chest consistent with bilateral PE -History somewhat clouded by concomitant issues related to what appears to be progressive yet undetected iron deficiency anemia which could also cause similar symptoms -Not on hormonal agents prior to admission, non-smoker -Suspect provoked PE in context of recent plane travel although symptoms could have preceded plane travel and were precipitated by recent influenza illness with dehydration in March 2019 -Begin anticoagulation with Lovenox full dose every 12 hours-pharmacy assisting -Patient continues to have periods with LMP 2 weeks ago therefore will need to be watched closely and follow-up with GYN after discharge -Patient has associated iron deficiency anemia which likely will require dietary adjustments to include leafy green vegetables which also are high in vitamin K therefore warfarin likely not best long-term A/C choice -Pharmacist to discuss A/C choice with patient but leaning towards NOAC -CM to clarify co-pay on NOAC's -Anticipate 6 months therapy -Check hypercoagulable panel only if has recurrent DVT or PE -CTA without evidence of right heart strain but for completeness of evaluation will obtain echocardiogram-initial troponin was normal -Bilateral LE duplex  Active Problems:   Symptomatic anemia/Iron deficiency -Patient has long-standing history of heavy menses during her first 1-1/2 days which has been ongoing for multiple years and has not changed -No signs  or symptoms of GI bleeding -Patient denies perimenopausal symptoms such as stuttering or inconsistent menses -Does not take birth control pills/hormones -Iron level quite low at 9 with a saturation of 2% therefore we will give a test dose of IV iron followed by 250 mg of iron then begin oral iron on 6/8 -Recommend follow-up with PCP for follow-up anemia panel in 6 weeks -No baseline Hgb for comparison but current hemoglobin quite low at 5.1.  Given relative hemodynamic stability with no signs of active bleeding suspect patient has had chronic progressive anemia in context of evolving/severe iron deficiency anemia -Obtain TSH -Given age will obtain transvaginal ultrasound to determine if has fibroids that could be contributing to initially heavy menses cycle    Incidental pulmonary nodule  -Noted on CTA of chest -Is non-smoker and lesion is subcentimeter but will at least need one additional study in 12 months recommended by radiology  **Additional lab, imaging and/or diagnostic evaluation at discretion of supervising physician  DVT prophylaxis: Full dose Lovenox Code Status: Full Family Communication: Mother Disposition Plan: Home Consults called: None    ELLIS,ALLISON L. ANP-BC [facility] Pager [phone]   If 7PM-7AM, please contact night-coverage [email or web address] Password [facility]  07/20/2017, 9:13 AM

## 2017-07-20 NOTE — ED Notes (Signed)
Meal tray delivered and at bedside.

## 2017-07-20 NOTE — ED Notes (Signed)
ED Provider at bedside. 

## 2017-07-20 NOTE — ED Triage Notes (Signed)
Pt arrived by EMS for chest pain and sob for the past several days. 10 days ago pt flew to [place], started having sob, went to UC there and was diagnosed with bronchitis due to altitude. From [place], pt drove back to [facility]. Pt reports worsening exertional sob with "heart pounding" when ambulating. Also has had a dry cough

## 2017-07-20 NOTE — Care Management (Signed)
Benefits check results:  S/W MINDY @ [facility] # [phone]    1. XARELTO 15 MG BID  COVER- YES  CO-PAY- $ 325.89  TIER- 3 DRUG  PRIOR APPROVAL- NO    2. XARELTO 20 MG DAILY  COVER- YES  CO-PAY- $ 325.89  TIER- 3 DRUG  PRIOR APPROVAL- NO    3. ELIQUIS  2.5 MG BID  COVER- YES  CO-PAY- $ 325.89  TIER- 3 DRUG  PRIOR APPROVAL- NO    4. ELIQUIS 5 MG BID  COVER- YES  CO-PAY- $ 325.89  TIER- 3 DRUG  PRIOR APPROVAL- NO    DEDUCTIBLE : NOT MET  OUT OF POCKET- NOT MET  AFTER THE DEDUCTIBLE HAS BEEN MET CO-PAY- $ 40.00    PREFERRED PHARMACY : YES [facility]

## 2017-07-21 ENCOUNTER — Observation Stay (HOSPITAL_COMMUNITY): Payer: BLUE CROSS/BLUE SHIELD

## 2017-07-21 DIAGNOSIS — I2699 Other pulmonary embolism without acute cor pulmonale: Secondary | ICD-10-CM | POA: Diagnosis not present

## 2017-07-21 LAB — TYPE AND SCREEN
ABO/RH(D): A POS
Antibody Screen: NEGATIVE
UNIT DIVISION: 0
Unit division: 0
Unit division: 0
Unit division: 0

## 2017-07-21 LAB — BASIC METABOLIC PANEL
ANION GAP: 10 (ref 5–15)
BUN: 6 mg/dL (ref 6–20)
CALCIUM: 8.5 mg/dL — AB (ref 8.9–10.3)
CO2: 22 mmol/L (ref 22–32)
Chloride: 106 mmol/L (ref 101–111)
Creatinine, Ser: 0.91 mg/dL (ref 0.44–1.00)
Glucose, Bld: 96 mg/dL (ref 65–99)
Potassium: 4 mmol/L (ref 3.5–5.1)
SODIUM: 138 mmol/L (ref 135–145)

## 2017-07-21 LAB — BPAM RBC
BLOOD PRODUCT EXPIRATION DATE: 201907012359
Blood Product Expiration Date: 201906302359
Blood Product Expiration Date: 201906302359
Blood Product Expiration Date: 201907012359
ISSUE DATE / TIME: 201906070850
ISSUE DATE / TIME: 201906072327
ISSUE DATE / TIME: 201906070505
ISSUE DATE / TIME: 201906071727
UNIT TYPE AND RH: 6200
UNIT TYPE AND RH: 6200
Unit Type and Rh: 6200
Unit Type and Rh: 6200

## 2017-07-21 LAB — CBC
HCT: 29.5 % — ABNORMAL LOW (ref 36.0–46.0)
Hemoglobin: 9.4 g/dL — ABNORMAL LOW (ref 12.0–15.0)
MCH: 24.7 pg — ABNORMAL LOW (ref 26.0–34.0)
MCHC: 31.9 g/dL (ref 30.0–36.0)
MCV: 77.6 fL — ABNORMAL LOW (ref 78.0–100.0)
Platelets: 273 10*3/uL (ref 150–400)
RBC: 3.8 MIL/uL — ABNORMAL LOW (ref 3.87–5.11)
RDW: 17.9 % — AB (ref 11.5–15.5)
WBC: 12.1 10*3/uL — AB (ref 4.0–10.5)

## 2017-07-21 LAB — HIV ANTIBODY (ROUTINE TESTING W REFLEX): HIV SCREEN 4TH GENERATION: NONREACTIVE

## 2017-07-21 MED ORDER — ENOXAPARIN SODIUM 80 MG/0.8ML ~~LOC~~ SOLN: 80.0000 mg | Freq: Two times a day (BID) | SUBCUTANEOUS | 0 refills | Status: AC

## 2017-07-21 MED ORDER — FERUMOXYTOL INJECTION 510 MG/17 ML
510.0000 mg | Freq: Once | INTRAVENOUS | Status: AC
  Administered 2017-07-21: 510 mg via INTRAVENOUS
  Filled 2017-07-21: qty 17

## 2017-07-21 MED ORDER — FERROUS SULFATE 325 (65 FE) MG PO TBEC: 325.0000 mg | DELAYED_RELEASE_TABLET | Freq: Two times a day (BID) | ORAL | 0 refills | Status: AC

## 2017-07-21 MED ORDER — SODIUM CHLORIDE 0.9 % IV SOLN: 510.0000 mg | Freq: Once | INTRAVENOUS | Status: DC

## 2017-07-21 MED ORDER — IOPAMIDOL (ISOVUE-300) INJECTION 61%: 15.0000 mL | INTRAVENOUS | Status: DC

## 2017-07-21 NOTE — Progress Notes (Signed)
Xray negative and pt urinated. MD believes that pain was related to bladder being extremely full. Pt escorted to exit via wheelchair and D/C'd home in private auto.

## 2017-07-21 NOTE — Discharge Summary (Signed)
Brianna Wiley YCX:[id number] DOB: [birth year] DOA: 07/20/2017  PCP: Medicine, [facility] Internal  Admit date: 07/20/2017  Discharge date: 07/21/2017  Admitted From: Home   Disposition:  Home   Recommendations for Outpatient Follow-up:   Follow up with PCP in 1-2 weeks  PCP Please obtain BMP/CBC, 2 view CXR in 1week,  (see Discharge instructions)   PCP Please follow up on the following pending results: Ca 125 levels, check CT abdomen pelvis with IV contrast in the next week and make sure patient follows with GYN oncology within 1 to 2 weeks.  She will also require Lovenox refills for her PE.  Also requires work-up for right-sided lung nodule kindly review all radiological reports below in detail.   Home Health: None   Equipment/Devices: None Consultations: GYN oncology at [facility] Dr. Gillian Scarce over the phone by me personally Discharge Condition: Fair CODE STATUS: Full Diet Recommendation: Heart healthy   Chief Complaint  Patient presents with  . Shortness of Breath  . Chest Pain     Brief history of present illness from the day of admission and additional interim summary    47 year old female otherwise healthy history of iron deficiency anemia due to menstrual blood loss along with seasonal allergies was admitted to the hospital for palpitations and shortness of breath.  Work-up revealed severe microcytic anemia with a hemoglobin of 5.1, bilateral PE and a right sided adnexal mass.  Of note she had recently traveled to [place].                                                                 Hospital Course     1.  Severe iron deficiency anemia due to menstrual blood loss.  Received 2 units of packed RBC transfusion along with IV Feraheme, H&H now stable no signs of ongoing blood loss,  symptoms much improved, currently no chest pain palpitations or shortness of breath.  Will be discharged on oral iron supplementation request PCP to monitor H&H and iron panel closely.  2.  Acute bilateral PE.  Without any right heart strain.  Based on Lovenox, currently stable on room air, heart rate stable, in no distress, will be discharged on Lovenox at this time.  3.  Right-sided lung nodule.  We by repeat CT scan in 6 months.  PCP to monitor and do age-appropriate outpatient work-up.  4.  Large right-sided adnexal mass.  Suspicious for ovarian malignancy, CA-125 levels ordered and pending, will require CT scan abdomen pelvis with IV contrast in the next 3 to 4 days to be done by PCP, case discussed with GYN oncologist Dr. Gillian Scarce by me personally over the phone on 07/21/2017 through transfer center.  At this time it is recommended that patient continue her PE treatment and follow with GYN  oncology at [facility] in the next 1 to 2 weeks.  Further work-up and treatment by them.    Discharge diagnosis     Principal Problem:   Bilateral pulmonary embolism (HCC) Active Problems:   Symptomatic anemia   Incidental pulmonary nodule   Iron deficiency    Discharge instructions    Discharge Instructions    Diet - low sodium heart healthy   Complete by:  As directed    Discharge instructions   Complete by:  As directed    Follow with Primary MD Medicine, [facility] Internal in 5 days   Get CBC, CMP, 2 view Chest X ray checked  by Primary MD   in 5  days   Activity: As tolerated with Full fall precautions use walker/cane & assistance as needed  Disposition Home    Diet:   Heart healthy  Special Instructions: If you have smoked or chewed Tobacco  in the last 2 yrs please stop smoking, stop any regular Alcohol  and or any Recreational drug use.  On your next visit with your primary care physician please Get Medicines reviewed and adjusted.  Please request your  Prim.MD to go over all Hospital Tests and Procedure/Radiological results at the follow up, please get all Hospital records sent to your Prim MD by signing hospital release before you go home.  If you experience worsening of your admission symptoms, develop shortness of breath, life threatening emergency, suicidal or homicidal thoughts you must seek medical attention immediately by calling 911 or calling your MD immediately  if symptoms less severe.  You Must read complete instructions/literature along with all the possible adverse reactions/side effects for all the Medicines you take and that have been prescribed to you. Take any new Medicines after you have completely understood and accpet all the possible adverse reactions/side effects.                                                          Brianna Wiley was admitted to the Hospital on 07/20/2017 and Discharged  07/21/2017 and should be excused from work/school   for 10 days starting from date -  07/20/2017 , may return to work/school without any restrictions.  Call Lala Lund MD, [facility]  [phone] with questions.  Lala Lund M.D on 07/21/2017,at 11:51 AM  [facility]   Office  [phone]   Increase activity slowly   Complete by:  As directed       Discharge Medications   Allergies as of 07/21/2017      Reactions   Codeine Anaphylaxis   Sulfa Antibiotics Hives      Medication List    TAKE these medications   cetirizine 10 MG tablet Commonly known as:  ZYRTEC Take 10 mg by mouth every morning.   enoxaparin 80 MG/0.8ML injection Commonly known as:  LOVENOX Inject 0.8 mLs (80 mg total) into the skin every 12 (twelve) hours.   ferrous sulfate 325 (65 FE) MG EC tablet Take 1 tablet (325 mg total) by mouth 2 (two) times daily.       Follow-up Information    Medicine, [facility] Internal. Schedule an appointment as soon as possible for a visit in 3 day(s).   Specialty:  Internal  Medicine       Carlis Abbott,  Marcello Fennel, MD. Schedule an appointment as soon as possible for a visit in 1 week(s).   Specialty:  Obstetrics and Gynecology Contact information: [place] [address] [phone]           Major procedures and Radiology Reports - PLEASE review detailed and final reports thoroughly  -       Dg Chest 2 View  Result Date: 07/20/2017 CLINICAL DATA:  Acute onset of generalized chest pain and shortness of breath. EXAM: CHEST - 2 VIEW COMPARISON:  None. FINDINGS: The lungs are well-aerated and clear. There is no evidence of focal opacification, pleural effusion or pneumothorax. The heart is borderline normal in size. No acute osseous abnormalities are seen. IMPRESSION: No acute cardiopulmonary process seen. Electronically Signed   By: Garald Balding M.D.   On: 07/20/2017 02:43   Ct Angio Chest Pe W And/or Wo Contrast  Result Date: 07/20/2017 CLINICAL DATA:  Shortness of breath EXAM: CT ANGIOGRAPHY CHEST WITH CONTRAST TECHNIQUE: Multidetector CT imaging of the chest was performed using the standard protocol during bolus administration of intravenous contrast. Multiplanar CT image reconstructions and MIPs were obtained to evaluate the vascular anatomy. CONTRAST:  80 mL ISOVUE-370 IOPAMIDOL (ISOVUE-370) INJECTION 76% COMPARISON:  Chest radiograph July 20, 2017 FINDINGS: Cardiovascular: There are pulmonary emboli arising from the distal most aspect of the right main pulmonary artery extending into multiple right upper and lower lobe branches. There also pulmonary emboli in several left upper and lower lobe pulmonary artery branches. The right ventricle to left ventricle diameter ratio is less than 0.9, not consistent with right heart strain. There is no thoracic aortic aneurysm or dissection. The visualized great vessels appear normal. There is no pericardial effusion or pericardial thickening. Mediastinum/Nodes: Thyroid appears unremarkable. There is no appreciable  thoracic adenopathy. No esophageal lesions are evident. Lungs/Pleura: There is mild atelectatic change in the left base. No pulmonary infarcts are evident. No consolidation noted. In the apical segment of the right upper lobe, there is a nodular opacity measuring 7 x 5 mm, best seen on axial slice 24 series 6. There is no pleural effusion or pleural thickening evident. Upper Abdomen: Visualized upper abdominal structures appear normal. Musculoskeletal: There are no blastic or lytic bone lesions. No chest wall lesions evident. Review of the MIP images confirms the above findings. IMPRESSION: 1. Bilateral pulmonary emboli, somewhat or extensive on the right than on the left but extending into multiple upper and lower lobe pulmonary arterial vessels bilaterally. No demonstrable right heart strain. 2.  No thoracic aortic aneurysm or dissection. 3. 7 x 5 mm nodular opacity in the apical segment right upper lobe. Non-contrast chest CT at 6-12 months is recommended. If the nodule is stable at time of repeat CT, then future CT at 18-24 months (from today's scan) is considered optional for low-risk patients, but is recommended for high-risk patients. This recommendation follows the consensus statement: Guidelines for Management of Incidental Pulmonary Nodules Detected on CT Images: From the Fleischner Society 2017; Radiology 2017; 284:228-243. 4.  No lung edema or consolidation.  Slight atelectasis left base. 5.  No appreciable thoracic adenopathy. Critical Value/emergent results were called by telephone at the time of interpretation on 07/20/2017 at 7:54 am to Dr. Jola Schmidt , who verbally acknowledged these results. Electronically Signed   By: Lowella Grip III M.D.   On: 07/20/2017 07:54   Mr Pelvis W Wo Contrast  Result Date: 07/20/2017 CLINICAL DATA:  Right ovarian mass on pelvic ultrasound. EXAM: MRI  PELVIS WITHOUT AND WITH CONTRAST TECHNIQUE: Multiplanar multisequence MR imaging of the pelvis was performed both  before and after administration of intravenous contrast. CONTRAST:  9mL MULTIHANCE GADOBENATE DIMEGLUMINE 529 MG/ML IV SOLN COMPARISON:  Pelvic ultrasound earlier the same date. Chest CT 07/20/2017. FINDINGS: Urinary Tract: The visualized distal ureters and bladder appear unremarkable. No evidence of hydroureter. Bowel: No bowel wall thickening, distention or surrounding inflammation identified within the pelvis. Mild sigmoid colon diverticular changes. Vascular/Lymphatic: No enlarged pelvic lymph nodes identified. No significant vascular findings. Reproductive: Uterus: Measures approximately 7.3 x 5.1 x 5.3 cm. The uterus is retroverted and retroflexed. There is irregular thickening of the junctional zone posteriorly, likely due to adenomyosis. The uterus enhances homogeneously following contrast, and no focal fibroids are demonstrated. Endometrium:  Normal in thickness without abnormal enhancement. Cervix/Vagina: Small cervical nabothian cysts. Otherwise unremarkable. Right ovary: As demonstrated on ultrasound, there is a large complex right adnexal mass which is likely arising from the right ovary, not seen separately. This mass measures 9.3 x 7.8 x 8.4 cm and has cystic and solid enhancing components. There is a large solid enhancing component anteriorly, measuring 4.4 x 6.2 cm on image 11/16. Of note, this mass abuts the sigmoid colon, although does not clearly invade it. Left ovary:  The left ovary appears normal.  No adnexal mass. Other: There is a small amount of pelvic ascites within peritoneal enhancement. Peritoneal nodular enhancement identified. Musculoskeletal: No evidence of osseous metastatic disease or acute findings. Mild lumbar spondylosis. IMPRESSION: 1. 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 Surgical excision warranted. 2. Small amount of ascites within peritoneal enhancement. Mass abuts the sigmoid colon, although does not clearly  invade it. No evidence of bowel or ureteral obstruction. Abdominopelvic CT recommended to better evaluate the peritoneal cavity and relationship of the mass to the sigmoid colon. 3. Uterine adenomyosis. Electronically Signed   By: Richardean Sale M.D.   On: 07/20/2017 16:48   US Pelvis Transvanginal Non-ob (tv Only)  Result Date: 07/20/2017 CLINICAL DATA:  Anemia, heavy menses EXAM: TRANSABDOMINAL AND TRANSVAGINAL ULTRASOUND OF PELVIS TECHNIQUE: Both transabdominal and transvaginal ultrasound examinations of the pelvis were performed. Transabdominal technique was performed for global imaging of the pelvis including uterus, ovaries, adnexal regions, and pelvic cul-de-sac. It was necessary to proceed with endovaginal exam following the transabdominal exam to visualize the ovaries. COMPARISON:  None FINDINGS: Uterus Measurements: 8.7 x 6.3 x 4.9 cm. Heterogeneous echotexture. No focal fibroid or mass lesion. Endometrium Thickness: 8 mm in thickness, difficult to visualize due to the heterogeneous uterus. No focal abnormality visualized. Right ovary Measurements: 9.7 x 8.3 x 8.6 cm. Complex cystic area measures 7.9 x 7.4 x 7.2 cm. There are nodular solid components along the wall. Left ovary Measurements: 3.3 x 2.2 x 2.2 cm. Normal appearance/no adnexal mass. Other findings Small amount of free fluid in the pelvis. IMPRESSION: Heterogeneous echotexture throughout the uterus which which can be seen with adenomyosis. Large complex cystic mass within the right ovary measuring up to 7.9 cm with mild mural nodularity. If not surgically excised, this could be further characterized with MRI. 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 Electronically Signed   By: Rolm Baptise M.D.   On: 07/20/2017 10:52   US Pelvis (transabdominal Only)  Result Date: 07/20/2017 CLINICAL DATA:  Anemia, heavy menses EXAM: TRANSABDOMINAL AND TRANSVAGINAL ULTRASOUND OF PELVIS TECHNIQUE: Both transabdominal and  transvaginal ultrasound examinations of the pelvis were performed. Transabdominal  technique was performed for global imaging of the pelvis including uterus, ovaries, adnexal regions, and pelvic cul-de-sac. It was necessary to proceed with endovaginal exam following the transabdominal exam to visualize the ovaries. COMPARISON:  None FINDINGS: Uterus Measurements: 8.7 x 6.3 x 4.9 cm. Heterogeneous echotexture. No focal fibroid or mass lesion. Endometrium Thickness: 8 mm in thickness, difficult to visualize due to the heterogeneous uterus. No focal abnormality visualized. Right ovary Measurements: 9.7 x 8.3 x 8.6 cm. Complex cystic area measures 7.9 x 7.4 x 7.2 cm. There are nodular solid components along the wall. Left ovary Measurements: 3.3 x 2.2 x 2.2 cm. Normal appearance/no adnexal mass. Other findings Small amount of free fluid in the pelvis. IMPRESSION: Heterogeneous echotexture throughout the uterus which which can be seen with adenomyosis. Large complex cystic mass within the right ovary measuring up to 7.9 cm with mild mural nodularity. If not surgically excised, this could be further characterized with MRI. 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 Electronically Signed   By: Rolm Baptise M.D.   On: 07/20/2017 10:52    Micro Results     No results found for this or any previous visit (from the past 240 hour(s)).  Today   Subjective    Brianna Wiley today has no headache,no chest abdominal pain,no new weakness tingling or numbness, feels much better wants to go home today.    Objective   Blood pressure 132/85, pulse 84, temperature 98.6 F (37 C), temperature source Oral, resp. rate 17, height 5\' 6"  (1.676 m), weight 87.6 kg (193 lb 2 oz), last menstrual period 07/04/2017, SpO2 97 %.   Intake/Output Summary (Last 24 hours) at 07/21/2017 1155 Last data filed at 07/21/2017 0245 Gross per 24 hour  Intake 965.83 ml  Output 200 ml  Net 765.83 ml     Exam Awake Alert, Oriented x 3, No new F.N deficits, Normal affect [place].AT,PERRAL Supple Neck,No JVD, No cervical lymphadenopathy appriciated.  Symmetrical Chest wall movement, Good air movement bilaterally, CTAB RRR,No Gallops,Rubs or new Murmurs, No Parasternal Heave +ve B.Sounds, Abd Soft, Non tender, No organomegaly appriciated, No rebound -guarding or rigidity. No Cyanosis, Clubbing or edema, No new Rash or bruise   Data Review   CBC w Diff:  Lab Results  Component Value Date   WBC 12.1 (H) 07/21/2017   HGB 9.4 (L) 07/21/2017   HCT 29.5 (L) 07/21/2017   PLT 273 07/21/2017    CMP:  Lab Results  Component Value Date   NA 138 07/21/2017   K 4.0 07/21/2017   CL 106 07/21/2017   CO2 22 07/21/2017   BUN 6 07/21/2017   CREATININE 0.91 07/21/2017   PROT 7.0 07/20/2017   ALBUMIN 3.1 (L) 07/20/2017   BILITOT 0.5 07/20/2017   ALKPHOS 97 07/20/2017   AST 23 07/20/2017   ALT 18 07/20/2017  .   Total Time in preparing paper work, data evaluation and todays exam - 49 minutes  Lala Lund M.D on 07/21/2017 at 11:55 AM  [facility]   Office  [phone]

## 2017-07-21 NOTE — Progress Notes (Addendum)
Verified w [facility] in [place] that they have Lovenox in stock. They ran it and it will be $310 this month because she has a deductible to fill. She is aware, and will be talking to her mom about it when she comes to pick her up at [place].  DOACs resulted with out of pocket cost of $325 and would not be a cheaper option.  Patient stated she will be able to afford Lovenox.

## 2017-07-21 NOTE — Progress Notes (Signed)
Brianna Wiley to be D/C'd Home per MD order.  Discussed with the patient and all questions fully answered.  VSS, Skin clean, dry and intact without evidence of skin break down, no evidence of skin tears noted. IV catheter discontinued intact. Site without signs and symptoms of complications. Dressing and pressure applied.  An After Visit Summary was printed and given to the patient. Patient received prescription.  D/c education completed with patient/family including follow up instructions, medication list, d/c activities limitations if indicated, with other d/c instructions as indicated by MD - patient able to verbalize understanding, all questions fully answered.   Patient instructed to return to ED, call 911, or call MD for any changes in condition.   Pt is waiting for her mother to come pick her up. Will continue to assess.  [name] 07/21/2017 12:42 PM

## 2017-07-21 NOTE — Progress Notes (Signed)
Pt report a new pain that is a 6/10 across her abdomin. MD Candiss Norse made aware. Stat abdominal xray and bladder scan ordered. Will continue to assess.

## 2017-07-21 NOTE — Progress Notes (Signed)
MEDICATION RELATED CONSULT NOTE - INITIAL   Pharmacy Consult for Feraheme Indication: Severe Iron Deficiency Anemia  Allergies  Allergen Reactions  . Codeine Anaphylaxis  . Sulfa Antibiotics Hives    Patient Measurements: Height: 5\' 6"  (167.6 cm) Weight: 193 lb 2 oz (87.6 kg) IBW/kg (Calculated) : 59.3 Lean Body Weight: 58.2 kg  Vital Signs: Temp: 98.6 F (37 C) (06/08 0500) Temp Source: Oral (06/08 0500) BP: 132/85 (06/08 0500) Pulse Rate: 84 (06/08 0500) Intake/Output from previous day: 06/07 0701 - 06/08 0700 In: 1645.8 [I.V.:5.8; Blood:1590; IV Piggyback:50] Out: 200 [Urine:200] Intake/Output from this shift: No intake/output data recorded.  Labs: Recent Labs    07/20/17 0201 07/20/17 0750 07/20/17 1412  WBC 12.6*  --  12.1*  HGB 5.1*  --  7.3*  HCT 17.5*  --  23.8*  PLT 315  --  261  CREATININE 1.05*  --   --   ALBUMIN  --  3.1*  --   PROT  --  7.0  --   AST  --  23  --   ALT  --  18  --   ALKPHOS  --  97  --   BILITOT  --  0.5  --   BILIDIR  --  <0.1*  --   IBILI  --  NOT CALCULATED  --    Estimated Creatinine Clearance: 73.8 mL/min (A) (by C-G formula based on SCr of 1.05 mg/dL (H)).   Microbiology: No results found for this or any previous visit (from the past 720 hour(s)).  Medical History: Past Medical History:  Diagnosis Date  . Seasonal allergies     Medications:  Scheduled:  . enoxaparin (LOVENOX) injection  80 mg Subcutaneous Q12H  . iron polysaccharides  150 mg Oral Daily  . loratadine  10 mg Oral Daily  . sodium chloride flush  3 mL Intravenous Q12H    Assessment: 47 year old female admitted with bilateral PE on Lovenox found to have low Hgb of 5.1 and severely low iron stores (iron 9, Tsats 2, high TIBC, Ferritin low at 13; no current infections noted). Patient was given Infed 250mg  on 6/7 and started on oral Niferex. Pharmacy was consulted to further supplement iron with IV Feraheme.    Last Hgb 7.3 after receiving total of 4  units of PBRCs (which gives about 200mg  of elemental iron per unit for total ~800mg ). Between this plus Infed patient has received ~1000mg  of IV iron up to this point.   Due to severity of stores and high iron need (~1500-2000mg ) will give another  Goal of Therapy:  Desired Hgb of 14.8 g/dL  Plan:  Feraheme 510mg  IV tomorrow AM (last dose Infed IV on 6/7) x1 dose.  Follow-up clinical status.  Consider repeat dose in ~5 days.   Sloan Leiter, PharmD, BCPS, BCCCP Clinical Pharmacist Clinical phone 07/21/2017 until 3:30PM [phone] After hours, please call #[phone] 07/21/2017,7:55 AM

## 2017-07-21 NOTE — Discharge Instructions (Signed)
Follow with Primary MD Medicine, [facility] Internal in 5 days   Get CBC, CMP, 2 view Chest X ray checked  by Primary MD   in 5  days   Activity: As tolerated with Full fall precautions use walker/cane & assistance as needed  Disposition Home    Diet:   Heart healthy  Special Instructions: If you have smoked or chewed Tobacco  in the last 2 yrs please stop smoking, stop any regular Alcohol  and or any Recreational drug use.  On your next visit with your primary care physician please Get Medicines reviewed and adjusted.  Please request your Prim.MD to go over all Hospital Tests and Procedure/Radiological results at the follow up, please get all Hospital records sent to your Prim MD by signing hospital release before you go home.  If you experience worsening of your admission symptoms, develop shortness of breath, life threatening emergency, suicidal or homicidal thoughts you must seek medical attention immediately by calling 911 or calling your MD immediately  if symptoms less severe.  You Must read complete instructions/literature along with all the possible adverse reactions/side effects for all the Medicines you take and that have been prescribed to you. Take any new Medicines after you have completely understood and accpet all the possible adverse reactions/side effects.                                                          Brianna Wiley was admitted to the Hospital on 07/20/2017 and Discharged  07/21/2017 and should be excused from work/school   for 10 days starting from date -  07/20/2017 , may return to work/school without any restrictions.  Call Lala Lund MD, [facility]  [phone] with questions.  Lala Lund M.D on 07/21/2017,at 11:51 AM  [facility]   Office  [phone]

## 2017-07-21 NOTE — Plan of Care (Signed)
  Problem: Clinical Measurements: Goal: Ability to maintain clinical measurements within normal limits will improve Outcome: Progressing   Problem: Clinical Measurements: Goal: Respiratory complications will improve Outcome: Progressing   Problem: Clinical Measurements: Goal: Cardiovascular complication will be avoided Outcome: Progressing   

## 2017-07-22 LAB — CA 125: CANCER ANTIGEN (CA) 125: 1433 U/mL — AB (ref 0.0–38.1)

## 2017-07-23 ENCOUNTER — Telehealth: Payer: Self-pay | Admitting: *Deleted

## 2017-07-23 NOTE — Telephone Encounter (Signed)
Called and left the patient a message to call the office back to schedule a new patient appt

## 2017-07-24 NOTE — Telephone Encounter (Signed)
Pt returned our call. Scheduled her appt for this Friday at 11am- arrive at 10:30 am. Pt voiced understanding and directions given. No other needs per pt at this time.

## 2017-07-25 ENCOUNTER — Other Ambulatory Visit: Payer: Self-pay | Admitting: Gynecologic Oncology

## 2017-07-25 DIAGNOSIS — K668 Other specified disorders of peritoneum: Secondary | ICD-10-CM

## 2017-07-25 DIAGNOSIS — K669 Disorder of peritoneum, unspecified: Secondary | ICD-10-CM

## 2017-07-25 NOTE — Progress Notes (Signed)
Consult Note: Gyn-Onc  Consult was requested by Dr. Lala Lund with [facility]   CC:  Chief Complaint  Patient presents with  . Ovarian mass    concommitent bilateral pulmonary emboli and significant anemia    HPI: [name]  is a very nice 47 y.o.  Virginal P0  In March 2019 she had the flu which she states she tested positive with her primary care provider.  She noted a fever and then about a week later started to have significant cough and was diagnosed with bronchitis.  She started to improve in the last week of May 2019 she traveled to [place] with her mother and felt like she was getting another or that the persistent cold was worsening.  She had increasing shortness of breath and palpitations with walking and noticed that her cough was worsening.  She was seen by a provider in [place] and was treated for upper respiratory.  She was also told that this may be related to altitude sickness.  She then flew home back to [facility] on July 19, 2017 and was so short of breath and feeling weak that her mother got a wheelchair for the patient when she got off the plane.  The sister met the patient and her mother and given her significant distress the sister insisted on calling 911.  The patient then was taken to the emergency room.  She was found to have a hemoglobin of 5.1 in addition work-up revealed bilateral pulmonary emboli with one side noted by radiology to be extensive as noted below.  In addition she told the hospitalist that she had menstrual cycles that were heavy the first couple of days and given the significant anemia a transvaginal ultrasound was ordered while she was admitted.  That also is noted below essentially revealing a right adnexal mass.  A follow-up MRI was ordered and this mentions a small amount of ascites and peritoneal enhancement.  Given those findings the patient was referred to our office for management being seen for the first time  07/27/2017.  CT Angio 07/20/17 - 1. Bilateral pulmonary emboli, somewhat or extensive on the right than on the left but extending into multiple upper and lower lobe pulmonary arterial vessels bilaterally.  TVUS 07/20/17 - Heterogeneous echotexture throughout the uterus which which can be seen with adenomyosis. Large complex cystic mass within the right ovary measuring up to 7.9 cm with mild mural nodularity. If not surgically excised, this could be further characterized with MRI. 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  MRI pelvis 07/20/17 - 1. 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 Surgical excision warranted. 2. Small amount of ascites within peritoneal enhancement. Mass abuts the sigmoid colon, although does not clearly invade it. No evidence of bowel or ureteral obstruction. Abdominopelvic CT recommended to better evaluate the peritoneal cavity and relationship of the mass to the sigmoid colon. 3. Uterine adenomyosis.  The patient states her periods are normal for her she does have some heavier bleeding the first couple of days which has been the normal.  She denies any intermenstrual spotting.  She has noted some right lower quadrant pain which is sharp and thinks it may have been worsened with the onset of her menstrual cycle.  She states it is worse at night.  She has noted some episodes with coughing that she will gag and have emesis but denies nausea.  She has noticed a  decrease in appetite but denies any early satiety.  States that she is up-to-date on her Pap smear with her last one being performed by her primary care provider Beth Day in 2018.  Notable is her elevated Ca1 25 drawn during her admission in addition to a beta hCG that was 71.  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  She does not know of  any other pain medications that she is been given since.   Oncologic History:    No history exists.    Measurement of disease:  Recent Labs    07/21/17 0945  [id number] 1,433.0*     Lab Results  Component Value Date   HCGBETAQNT 71 (H) 07/20/2017   CEA - TBD   ___________  Radiology: Dg Chest 2 View  Result Date: 07/20/2017 CLINICAL DATA:  Acute onset of generalized chest pain and shortness of breath. EXAM: CHEST - 2 VIEW COMPARISON:  None. FINDINGS: The lungs are well-aerated and clear. There is no evidence of focal opacification, pleural effusion or pneumothorax. The heart is borderline normal in size. No acute osseous abnormalities are seen. IMPRESSION: No acute cardiopulmonary process seen. Electronically Signed   By: Garald Balding M.D.   On: 07/20/2017 02:43   Ct Angio Chest Pe W And/or Wo Contrast  Result Date: 07/20/2017 CLINICAL DATA:  Shortness of breath EXAM: CT ANGIOGRAPHY CHEST WITH CONTRAST TECHNIQUE: Multidetector CT imaging of the chest was performed using the standard protocol during bolus administration of intravenous contrast. Multiplanar CT image reconstructions and MIPs were obtained to evaluate the vascular anatomy. CONTRAST:  80 mL ISOVUE-370 IOPAMIDOL (ISOVUE-370) INJECTION 76% COMPARISON:  Chest radiograph July 20, 2017 FINDINGS: Cardiovascular: There are pulmonary emboli arising from the distal most aspect of the right main pulmonary artery extending into multiple right upper and lower lobe branches. There also pulmonary emboli in several left upper and lower lobe pulmonary artery branches. The right ventricle to left ventricle diameter ratio is less than 0.9, not consistent with right heart strain. There is no thoracic aortic aneurysm or dissection. The visualized great vessels appear normal. There is no pericardial effusion or pericardial thickening. Mediastinum/Nodes: Thyroid appears unremarkable. There is no appreciable thoracic adenopathy. No esophageal lesions  are evident. Lungs/Pleura: There is mild atelectatic change in the left base. No pulmonary infarcts are evident. No consolidation noted. In the apical segment of the right upper lobe, there is a nodular opacity measuring 7 x 5 mm, best seen on axial slice 24 series 6. There is no pleural effusion or pleural thickening evident. Upper Abdomen: Visualized upper abdominal structures appear normal. Musculoskeletal: There are no blastic or lytic bone lesions. No chest wall lesions evident. Review of the MIP images confirms the above findings. IMPRESSION: 1. Bilateral pulmonary emboli, somewhat or extensive on the right than on the left but extending into multiple upper and lower lobe pulmonary arterial vessels bilaterally. No demonstrable right heart strain. 2.  No thoracic aortic aneurysm or dissection. 3. 7 x 5 mm nodular opacity in the apical segment right upper lobe. Non-contrast chest CT at 6-12 months is recommended. If the nodule is stable at time of repeat CT, then future CT at 18-24 months (from today's scan) is considered optional for low-risk patients, but is recommended for high-risk patients. This recommendation follows the consensus statement: Guidelines for Management of Incidental Pulmonary Nodules Detected on CT Images: From the Fleischner Society 2017; Radiology 2017;  [id number]. 4.  No lung edema or consolidation.  Slight atelectasis left base. 5.  No appreciable thoracic adenopathy. Critical Value/emergent results were called by telephone at the time of interpretation on 07/20/2017 at 7:54 am to Dr. Jola Schmidt , who verbally acknowledged these results. Electronically Signed   By: Lowella Grip III M.D.   On: 07/20/2017 07:54   Mr Pelvis W Wo Contrast  Result Date: 07/20/2017 CLINICAL DATA:  Right ovarian mass on pelvic ultrasound. EXAM: MRI PELVIS WITHOUT AND WITH CONTRAST TECHNIQUE: Multiplanar multisequence MR imaging of the pelvis was performed both before and after administration of  intravenous contrast. CONTRAST:  29m MULTIHANCE GADOBENATE DIMEGLUMINE 529 MG/ML IV SOLN COMPARISON:  Pelvic ultrasound earlier the same date. Chest CT 07/20/2017. FINDINGS: Urinary Tract: The visualized distal ureters and bladder appear unremarkable. No evidence of hydroureter. Bowel: No bowel wall thickening, distention or surrounding inflammation identified within the pelvis. Mild sigmoid colon diverticular changes. Vascular/Lymphatic: No enlarged pelvic lymph nodes identified. No significant vascular findings. Reproductive: Uterus: Measures approximately 7.3 x 5.1 x 5.3 cm. The uterus is retroverted and retroflexed. There is irregular thickening of the junctional zone posteriorly, likely due to adenomyosis. The uterus enhances homogeneously following contrast, and no focal fibroids are demonstrated. Endometrium:  Normal in thickness without abnormal enhancement. Cervix/Vagina: Small cervical nabothian cysts. Otherwise unremarkable. Right ovary: As demonstrated on ultrasound, there is a large complex right adnexal mass which is likely arising from the right ovary, not seen separately. This mass measures 9.3 x 7.8 x 8.4 cm and has cystic and solid enhancing components. There is a large solid enhancing component anteriorly, measuring 4.4 x 6.2 cm on image 11/16. Of note, this mass abuts the sigmoid colon, although does not clearly invade it. Left ovary:  The left ovary appears normal.  No adnexal mass. Other: There is a small amount of pelvic ascites within peritoneal enhancement. Peritoneal nodular enhancement identified. Musculoskeletal: No evidence of osseous metastatic disease or acute findings. Mild lumbar spondylosis. IMPRESSION: 1. 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 Surgical excision warranted. 2. Small amount of ascites within peritoneal enhancement. Mass abuts the sigmoid colon, although does not clearly invade it. No evidence of bowel or  ureteral obstruction. Abdominopelvic CT recommended to better evaluate the peritoneal cavity and relationship of the mass to the sigmoid colon. 3. Uterine adenomyosis. Electronically Signed   By: WRichardean SaleM.D.   On: 07/20/2017 16:48   [place]Pelvis Transvanginal Non-ob (tv Only)  Result Date: 07/20/2017 CLINICAL DATA:  Anemia, heavy menses EXAM: TRANSABDOMINAL AND TRANSVAGINAL ULTRASOUND OF PELVIS TECHNIQUE: Both transabdominal and transvaginal ultrasound examinations of the pelvis were performed. Transabdominal technique was performed for global imaging of the pelvis including uterus, ovaries, adnexal regions, and pelvic cul-de-sac. It was necessary to proceed with endovaginal exam following the transabdominal exam to visualize the ovaries. COMPARISON:  None FINDINGS: Uterus Measurements: 8.7 x 6.3 x 4.9 cm. Heterogeneous echotexture. No focal fibroid or mass lesion. Endometrium Thickness: 8 mm in thickness, difficult to visualize due to the heterogeneous uterus. No focal abnormality visualized. Right ovary Measurements: 9.7 x 8.3 x 8.6 cm. Complex cystic area measures 7.9 x 7.4 x 7.2 cm. There are nodular solid components along the wall. Left ovary Measurements: 3.3 x 2.2 x 2.2 cm. Normal appearance/no adnexal mass. Other findings Small amount of free fluid in the pelvis. IMPRESSION: Heterogeneous echotexture throughout the uterus which which can be seen with adenomyosis. Large complex cystic mass within the right  ovary measuring up to 7.9 cm with mild mural nodularity. If not surgically excised, this could be further characterized with MRI. 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 Electronically Signed   By: Rolm Baptise M.D.   On: 07/20/2017 10:52   US Pelvis (transabdominal Only)  Result Date: 07/20/2017 CLINICAL DATA:  Anemia, heavy menses EXAM: TRANSABDOMINAL AND TRANSVAGINAL ULTRASOUND OF PELVIS TECHNIQUE: Both transabdominal and transvaginal ultrasound examinations  of the pelvis were performed. Transabdominal technique was performed for global imaging of the pelvis including uterus, ovaries, adnexal regions, and pelvic cul-de-sac. It was necessary to proceed with endovaginal exam following the transabdominal exam to visualize the ovaries. COMPARISON:  None FINDINGS: Uterus Measurements: 8.7 x 6.3 x 4.9 cm. Heterogeneous echotexture. No focal fibroid or mass lesion. Endometrium Thickness: 8 mm in thickness, difficult to visualize due to the heterogeneous uterus. No focal abnormality visualized. Right ovary Measurements: 9.7 x 8.3 x 8.6 cm. Complex cystic area measures 7.9 x 7.4 x 7.2 cm. There are nodular solid components along the wall. Left ovary Measurements: 3.3 x 2.2 x 2.2 cm. Normal appearance/no adnexal mass. Other findings Small amount of free fluid in the pelvis. IMPRESSION: Heterogeneous echotexture throughout the uterus which which can be seen with adenomyosis. Large complex cystic mass within the right ovary measuring up to 7.9 cm with mild mural nodularity. If not surgically excised, this could be further characterized with MRI. 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 Electronically Signed   By: Rolm Baptise M.D.   On: 07/20/2017 10:52   Dg Abd Portable 2v  Result Date: 07/21/2017 CLINICAL DATA:  Abdominal pain for several days. EXAM: PORTABLE ABDOMEN - 2 VIEW COMPARISON:  None. FINDINGS: The bowel gas pattern is normal. There is no evidence of free air. No radio-opaque calculi or other significant radiographic abnormality is seen. IMPRESSION: Negative. Electronically Signed   By: Earle Gell M.D.   On: 07/21/2017 16:23     Current Meds:  Outpatient Encounter Medications as of 07/27/2017  Medication Sig  . Acetaminophen (MIDOL PO) Take 1 tablet by mouth. Midol as needed for menstrual cramps  . cetirizine (ZYRTEC) 10 MG tablet Take 10 mg by mouth every morning.  . enoxaparin (LOVENOX) 80 MG/0.8ML injection Inject 0.8 mLs  (80 mg total) into the skin every 12 (twelve) hours.  . ferrous sulfate 325 (65 FE) MG EC tablet Take 1 tablet (325 mg total) by mouth 2 (two) times daily.  . benzonatate (TESSALON) 100 MG capsule Take 1 capsule (100 mg total) by mouth 3 (three) times daily as needed for cough.   No facility-administered encounter medications on file as of 07/27/2017.     Allergy:  Allergies  Allergen Reactions  . Codeine Anaphylaxis  . Sulfa Antibiotics Hives    Social Hx:  Tobacco use: None Alcohol use: None Illicit Drug use: None Illicit IV Drug use: None  Past Surgical Hx:  Past Surgical History:  Procedure Laterality Date  . THYROID SURGERY Left 2004   removal of benign nodule  . wisdom teeth      Past Medical Hx:  Past Medical History:  Diagnosis Date  . Anemia   . Seasonal allergies     Past Gynecological History:   GYNECOLOGIC HISTORY:  Patient's last menstrual period was 07/04/2017. Menarche: [age] years old P 0 LMP regular with her currently having her menses as of July 27, 2017 Contraceptive never HRT none  Last Pap pending from 2018 at primary  care provider_____________________  Family Hx:  Family History  Problem Relation Age of Onset  . Iron deficiency Mother   . Anemia Sister   . Iron deficiency Sister   . Goiter Sister   . Thyroid cancer Maternal Grandmother   . Bladder Cancer Maternal Grandfather   . Breast cancer Cousin        maternal 2nd cousin    Review of Systems:  Review of Systems  Constitutional: Positive for fatigue.  Respiratory: Positive for chest tightness and cough.   All other systems reviewed and are negative. Chest pain when coughing RLQ pain   Vitals:  Blood pressure 131/81, pulse (!) 104, temperature 98.4 F (36.9 C), temperature source Oral, resp. rate 20, height 5' 6"  (1.676 m), weight 181 lb 9.6 oz (82.4 kg), last menstrual period 07/04/2017, SpO2 97 %. Body mass index is 29.31 kg/m.   Physical Exam: ECOG PERFORMANCE  STATUS: 1 - Symptomatic but completely ambulatory   General :  Well developed, 47 y.o., female in no apparent distress HEENT:  Normocephalic/atraumatic, symmetric, EOMI, eyelids normal Neck:   Supple, no masses.  Lymphatics:  No cervical/ submandibular/ supraclavicular/ infraclavicular/ inguinal adenopathy Respiratory:  Respirations unlabored, no use of accessory muscles CV:   Deferred Breast:  Deferred Musculoskeletal: No CVA tenderness, normal muscle strength. Abdomen:  Soft, non-tender and nondistended. No evidence of hernia. No masses. Extremities:  No lymphedema, no erythema, non-tender. Skin:   Normal inspection Neuro/Psych:  No focal motor deficit, no abnormal mental status. Normal gait. Normal affect. Alert and oriented to person, place, and time  Genito Urinary: Vulva: Normal external female genitalia.  Bladder/urethra: Urethral meatus normal in size and location. No lesions or   masses, well supported bladder Speculum exam: Vagina: No lesion, no discharge, no bleeding. Cervix: nulliparous, normal appearing, no lesions. + menses Bimanual exam: Difficult exam given vaginal vault caliber and patient discomfort Uterus: mobile.  Unable to determine size given difficult exam  Adnexa: No masses. Rectovaginal:  I believe that I feel fullness in the right cul-de-sac but again it is a poor exam given her intolerance.  Good tone, no cul de sac nodularity, no parametrial involvement or nodularity.   Assessment/Plan: 1. Bilateral pulmonary emboli o Refer to pulmonary as we will want their input when we do proceed with surgery 2. Anemia o Do not know the etiology of her anemia.  This may be anemia of chronic disease related to malignancy.  Based on what she is tell me about her menstrual cycle it does not sound like it would be due to her menses. 3. Treatment planning o Needs tissue confirmation. MRI sees peritoneal lesions -however radiology does not feel that these are significant  enough to warrant a direct referral for biopsy o Will await CT to look for accessible lesions o Assuming ovarian primary and serous CA, will recommend neoadjuvant chemo given high surgical morbidity risks with recent bilateral and "somewhat" extensive PE's. i. Ultimate planning will depend on histology. 4. Possible interval debulking post cycle 3 o She should be close to 3 months post PE by then o We'll need clearance either from Med Onc or Pulm/ 5. Lung nodules o 7 x 5 mm nodular opacity in the apical segment right upper lobe. Non-contrast chest CT at 6-12 months is recommended. If the nodule is stable at time of repeat CT, then future CT at 18-24 months (from today's scan) is considered optional for low-risk patients, but is recommended for high-risk patients. 6. Tumor markers o Interesting that  her BhCG is elevated, although Upreg negative o Add CEA o CA125 noted to be elevated. 7. Pap smear - need copy from PCP 8. Cough - Tessalon perles  Isabel Caprice, MD  07/27/2017, 1:26 PM  Cc: Lala Lund, MD (Referring Hospitalist) Yevonne Pax, MD  (PCP to start)

## 2017-07-27 ENCOUNTER — Inpatient Hospital Stay: Payer: BLUE CROSS/BLUE SHIELD

## 2017-07-27 ENCOUNTER — Inpatient Hospital Stay: Payer: BLUE CROSS/BLUE SHIELD | Attending: Obstetrics | Admitting: Obstetrics

## 2017-07-27 ENCOUNTER — Encounter: Payer: Self-pay | Admitting: Obstetrics

## 2017-07-27 VITALS — BP 131/81 | HR 104 | Temp 98.4°F | Resp 20 | Ht 66.0 in | Wt 181.6 lb

## 2017-07-27 DIAGNOSIS — N838 Other noninflammatory disorders of ovary, fallopian tube and broad ligament: Secondary | ICD-10-CM

## 2017-07-27 DIAGNOSIS — R933 Abnormal findings on diagnostic imaging of other parts of digestive tract: Secondary | ICD-10-CM | POA: Diagnosis not present

## 2017-07-27 DIAGNOSIS — R059 Cough, unspecified: Secondary | ICD-10-CM

## 2017-07-27 DIAGNOSIS — R05 Cough: Secondary | ICD-10-CM

## 2017-07-27 DIAGNOSIS — D649 Anemia, unspecified: Secondary | ICD-10-CM | POA: Diagnosis not present

## 2017-07-27 DIAGNOSIS — N839 Noninflammatory disorder of ovary, fallopian tube and broad ligament, unspecified: Secondary | ICD-10-CM | POA: Diagnosis present

## 2017-07-27 DIAGNOSIS — I2699 Other pulmonary embolism without acute cor pulmonale: Secondary | ICD-10-CM

## 2017-07-27 DIAGNOSIS — R971 Elevated cancer antigen 125 [CA 125]: Secondary | ICD-10-CM | POA: Diagnosis not present

## 2017-07-27 LAB — CEA (IN HOUSE-CHCC): CEA (CHCC-In House): 2.44 ng/mL (ref 0.00–5.00)

## 2017-07-27 MED ORDER — BENZONATATE 100 MG PO CAPS
100.0000 mg | ORAL_CAPSULE | Freq: Three times a day (TID) | ORAL | 2 refills | Status: AC | PRN
Start: 2017-07-27 — End: ?

## 2017-07-27 NOTE — Patient Instructions (Addendum)
1. CT scan will be ordered to look for areas of concern outside your ovary 2. If there is something outside your ovary we can sample we will arrange for that to be done 3. CEA will be drawn today, this is a blood test for cancer 4. Copy of Pap smear will be requested before your return 5. Return in the next few days after the CT and biopsy (if done) results completed  6.   We will refer you to meet with a pulmonologist as well.

## 2017-07-30 ENCOUNTER — Other Ambulatory Visit: Payer: Self-pay | Admitting: Gynecologic Oncology

## 2017-07-30 ENCOUNTER — Telehealth: Payer: Self-pay

## 2017-07-30 DIAGNOSIS — R1031 Right lower quadrant pain: Secondary | ICD-10-CM

## 2017-07-30 MED ORDER — TRAMADOL HCL 50 MG PO TABS: 50.0000 mg | ORAL_TABLET | Freq: Four times a day (QID) | ORAL | 0 refills | Status: DC | PRN

## 2017-07-30 NOTE — Telephone Encounter (Signed)
Outgoing call to pt per [name] NP regarding CEA results as "within normal range".  Pt also left a VM earlier and pt reports having right lower abd pain - states she reported at her recent visit here but over weekend it worsened and couldn't sleep, hardly walk, and would like to try something for pain - tylenol, aleve, or ibuprofen didn't help.  Notified Dr Gerarda Fraction / [name] NP including her Allergy to Codeine.  Prescription to her pharmacy of Tramadol - cautioned pt as she is allergic to Codeine and limits pain medications to try.  Instructed to have benadryl close by to take if needed, can try taking half tablet at first, and to have someone with her when she takes for the first time and any signs of allergy - go to nearest ER.  Pt voiced understanding. Pt will call for any further issues, questions, worsening of symptoms- she has CT on 08-01-2017.

## 2017-07-30 NOTE — Progress Notes (Signed)
See RN note.  Patient called reporting increased right LQ pain that worsened over the weekend.  CT planned for Wed of this week. Situation discussed with Dr. Gerarda Fraction.  Patient states when in high school she took codeine and felt her throat was swelling shut.  She denies being able to take any other pain medication since she has not had to use anything.  Per Dr. Gerarda Fraction, plan to try tramadol 50 mg Q6H PRN severe pain.  Patient advised to use caution and to go to the ER for any swelling symptoms, difficulty breathing, etc.

## 2017-08-01 ENCOUNTER — Ambulatory Visit (HOSPITAL_COMMUNITY)
Admission: RE | Admit: 2017-08-01 | Discharge: 2017-08-01 | Disposition: A | Discharge status: Home / Self Care | Payer: BLUE CROSS/BLUE SHIELD | Source: Ambulatory Visit | Attending: Obstetrics | Admitting: Obstetrics

## 2017-08-01 DIAGNOSIS — N1330 Unspecified hydronephrosis: Secondary | ICD-10-CM | POA: Diagnosis not present

## 2017-08-01 DIAGNOSIS — K7689 Other specified diseases of liver: Secondary | ICD-10-CM | POA: Diagnosis not present

## 2017-08-01 DIAGNOSIS — N839 Noninflammatory disorder of ovary, fallopian tube and broad ligament, unspecified: Secondary | ICD-10-CM | POA: Diagnosis present

## 2017-08-01 DIAGNOSIS — N838 Other noninflammatory disorders of ovary, fallopian tube and broad ligament: Secondary | ICD-10-CM

## 2017-08-01 MED ORDER — IOPAMIDOL (ISOVUE-300) INJECTION 61%
INTRAVENOUS | Status: AC
  Filled 2017-08-01: qty 100

## 2017-08-01 MED ORDER — IOPAMIDOL (ISOVUE-300) INJECTION 61%
100.0000 mL | Freq: Once | INTRAVENOUS | Status: AC | PRN
  Administered 2017-08-01: 100 mL via INTRAVENOUS

## 2017-08-02 ENCOUNTER — Telehealth: Payer: Self-pay | Admitting: Oncology

## 2017-08-02 NOTE — Telephone Encounter (Signed)
Left a message notifying patient of appointment with Dr. Melvyn Novas on 09/06/17 at 10:30.

## 2017-08-02 NOTE — Telephone Encounter (Signed)
Patient called back and was advised of appointment date and time.  She is concerned because the appointment is so far out.  Called [facility] again to see if there was anything sooner.  They said 09/06/17 is the first appointment available.  They said they do not have a wait list but that patient should call daily to see if there is a cancellation. Called patient back and advised her of above.

## 2017-08-03 ENCOUNTER — Other Ambulatory Visit: Payer: Self-pay | Admitting: Gynecologic Oncology

## 2017-08-03 ENCOUNTER — Telehealth: Payer: Self-pay

## 2017-08-03 DIAGNOSIS — K769 Liver disease, unspecified: Secondary | ICD-10-CM

## 2017-08-03 NOTE — Telephone Encounter (Signed)
Incoming call from patient regarding whether she should continue to take her blood thinner?  Notified Brianna John NP- per her, instructed pt to continue blood thinner and someone from radiology should contact her with an appt and instructions to stop blood thinner closer to the appt they schedule with her.  Pt voiced understanding. No other needs per pt at this time. Left VM for radiology [facility] to f/u with pt for scheduling her appt.

## 2017-08-06 ENCOUNTER — Other Ambulatory Visit: Payer: Self-pay | Admitting: Gynecologic Oncology

## 2017-08-06 ENCOUNTER — Telehealth: Payer: Self-pay

## 2017-08-06 ENCOUNTER — Telehealth: Payer: Self-pay | Admitting: *Deleted

## 2017-08-06 DIAGNOSIS — R1031 Right lower quadrant pain: Secondary | ICD-10-CM

## 2017-08-06 MED ORDER — TRAMADOL HCL 50 MG PO TABS: 50.0000 mg | ORAL_TABLET | Freq: Four times a day (QID) | ORAL | 0 refills | Status: DC | PRN

## 2017-08-06 NOTE — Telephone Encounter (Signed)
[facility] from radiology called and reported she has spoke with patient regarding biopsy appt for this Friday June 28th.  She also instructed her on holding lovenox dose the night before procedure (Thurs night) and holding her lovenox dose the morning of procedure (Friday morning).  Pt has our contact info for any other questions or concerns.

## 2017-08-06 NOTE — Progress Notes (Signed)
Patient called requesting refill on medication.  Patient stating she is taking the medication once every twelve hours as needed for intermittent abdominal pain, more in the evenings.  Refill given.  Do not take and drive on script.

## 2017-08-06 NOTE — Telephone Encounter (Signed)
Patient called in to ask about a refill on her Tramadol. Patient states "the medication is really helping but I only have one pill left, is there anyway I can get a refill on the medication".  I spoke with [name], NP and she told me to tell the patient that she will send in a refill to the pharmacy on file, which is [facility] in [place].  Patient was very appreciative and verbalized understanding.  I told Brianna Wiley to feel free to give our office a call if she had any further questions or concerns.  Patient verbalized understanding.

## 2017-08-09 ENCOUNTER — Other Ambulatory Visit: Payer: Self-pay | Admitting: Student

## 2017-08-09 ENCOUNTER — Encounter: Payer: Self-pay | Admitting: Student

## 2017-08-09 ENCOUNTER — Other Ambulatory Visit: Payer: Self-pay | Admitting: Radiology

## 2017-08-10 ENCOUNTER — Encounter (HOSPITAL_COMMUNITY): Payer: Self-pay

## 2017-08-10 ENCOUNTER — Ambulatory Visit (HOSPITAL_COMMUNITY)
Admission: RE | Admit: 2017-08-10 | Discharge: 2017-08-10 | Disposition: A | Discharge status: Home / Self Care | Payer: BLUE CROSS/BLUE SHIELD | Source: Ambulatory Visit | Attending: Gynecologic Oncology | Admitting: Gynecologic Oncology

## 2017-08-10 DIAGNOSIS — Z803 Family history of malignant neoplasm of breast: Secondary | ICD-10-CM | POA: Diagnosis not present

## 2017-08-10 DIAGNOSIS — I2699 Other pulmonary embolism without acute cor pulmonale: Secondary | ICD-10-CM | POA: Insufficient documentation

## 2017-08-10 DIAGNOSIS — K769 Liver disease, unspecified: Secondary | ICD-10-CM

## 2017-08-10 DIAGNOSIS — Z832 Family history of diseases of the blood and blood-forming organs and certain disorders involving the immune mechanism: Secondary | ICD-10-CM | POA: Insufficient documentation

## 2017-08-10 DIAGNOSIS — Z79899 Other long term (current) drug therapy: Secondary | ICD-10-CM | POA: Insufficient documentation

## 2017-08-10 DIAGNOSIS — C787 Secondary malignant neoplasm of liver and intrahepatic bile duct: Secondary | ICD-10-CM | POA: Insufficient documentation

## 2017-08-10 DIAGNOSIS — Z808 Family history of malignant neoplasm of other organs or systems: Secondary | ICD-10-CM | POA: Diagnosis not present

## 2017-08-10 DIAGNOSIS — D649 Anemia, unspecified: Secondary | ICD-10-CM | POA: Insufficient documentation

## 2017-08-10 DIAGNOSIS — R188 Other ascites: Secondary | ICD-10-CM | POA: Diagnosis not present

## 2017-08-10 DIAGNOSIS — Z9889 Other specified postprocedural states: Secondary | ICD-10-CM | POA: Diagnosis not present

## 2017-08-10 DIAGNOSIS — Z885 Allergy status to narcotic agent status: Secondary | ICD-10-CM | POA: Diagnosis not present

## 2017-08-10 DIAGNOSIS — Z7901 Long term (current) use of anticoagulants: Secondary | ICD-10-CM | POA: Insufficient documentation

## 2017-08-10 DIAGNOSIS — N1330 Unspecified hydronephrosis: Secondary | ICD-10-CM | POA: Insufficient documentation

## 2017-08-10 DIAGNOSIS — Z79891 Long term (current) use of opiate analgesic: Secondary | ICD-10-CM | POA: Diagnosis not present

## 2017-08-10 DIAGNOSIS — Z882 Allergy status to sulfonamides status: Secondary | ICD-10-CM | POA: Diagnosis not present

## 2017-08-10 DIAGNOSIS — Z8052 Family history of malignant neoplasm of bladder: Secondary | ICD-10-CM | POA: Insufficient documentation

## 2017-08-10 DIAGNOSIS — N839 Noninflammatory disorder of ovary, fallopian tube and broad ligament, unspecified: Secondary | ICD-10-CM | POA: Diagnosis not present

## 2017-08-10 DIAGNOSIS — R160 Hepatomegaly, not elsewhere classified: Secondary | ICD-10-CM | POA: Diagnosis present

## 2017-08-10 LAB — CBC
HEMATOCRIT: 32.6 % — AB (ref 36.0–46.0)
Hemoglobin: 9.9 g/dL — ABNORMAL LOW (ref 12.0–15.0)
MCH: 24.6 pg — ABNORMAL LOW (ref 26.0–34.0)
MCHC: 30.4 g/dL (ref 30.0–36.0)
MCV: 81.1 fL (ref 78.0–100.0)
PLATELETS: 613 10*3/uL — AB (ref 150–400)
RBC: 4.02 MIL/uL (ref 3.87–5.11)
RDW: 20.6 % — AB (ref 11.5–15.5)
WBC: 11.1 10*3/uL — AB (ref 4.0–10.5)

## 2017-08-10 LAB — PROTIME-INR
INR: 1.07
PROTHROMBIN TIME: 13.8 s (ref 11.4–15.2)

## 2017-08-10 LAB — APTT: APTT: 36 s (ref 24–36)

## 2017-08-10 MED ORDER — SODIUM CHLORIDE 0.9 % IV SOLN
INTRAVENOUS | Status: AC | PRN
  Administered 2017-08-10: 10 mL/h via INTRAVENOUS

## 2017-08-10 MED ORDER — LIDOCAINE HCL (PF) 1 % IJ SOLN
INTRAMUSCULAR | Status: AC
  Filled 2017-08-10: qty 10

## 2017-08-10 MED ORDER — GELATIN ABSORBABLE 12-7 MM EX MISC
CUTANEOUS | Status: AC
  Filled 2017-08-10: qty 1

## 2017-08-10 MED ORDER — FENTANYL CITRATE (PF) 100 MCG/2ML IJ SOLN
INTRAMUSCULAR | Status: AC | PRN
  Administered 2017-08-10: 25 ug via INTRAVENOUS
  Administered 2017-08-10: 50 ug via INTRAVENOUS
  Administered 2017-08-10: 25 ug via INTRAVENOUS

## 2017-08-10 MED ORDER — MIDAZOLAM HCL 2 MG/2ML IJ SOLN
INTRAMUSCULAR | Status: AC
  Filled 2017-08-10: qty 2

## 2017-08-10 MED ORDER — SODIUM CHLORIDE 0.9 % IV SOLN: INTRAVENOUS | Status: DC

## 2017-08-10 MED ORDER — FENTANYL CITRATE (PF) 100 MCG/2ML IJ SOLN
INTRAMUSCULAR | Status: AC
  Filled 2017-08-10: qty 2

## 2017-08-10 MED ORDER — MIDAZOLAM HCL 2 MG/2ML IJ SOLN
INTRAMUSCULAR | Status: AC | PRN
  Administered 2017-08-10 (×2): 0.5 mg via INTRAVENOUS
  Administered 2017-08-10: 1 mg via INTRAVENOUS

## 2017-08-10 NOTE — H&P (Signed)
Chief Complaint: Patient was seen in consultation today for liver lesion biopsy at the request of Cross,Melissa D  Referring Physician(s): Cross,Melissa D Dr Romeo Apple  Supervising Physician: Arne Cleveland  Patient Status: [facility] - Out-pt  History of Present Illness: Brianna Wiley is a 47 y.o. 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 Potential involvement of adjacent sigmoid colon cannot be excluded. There is seen wall thickening involving the terminal ileum near this lesion suggesting metastatic involvement. Multiple hepatic lesions are noted consistent with metastatic disease. The largest measures 8 cm in diameter. 1 cm right periaortic lymph node is noted concerning for metastatic disease. Moderate right hydroureteronephrosis is noted which appears to be due to obstruction of the distal right ureter secondary to the right ovarian mass.  Referred to [facility] for liver lesion biopsy LD Lovenox 6/27 7 am   Past Medical History:  Diagnosis Date  . Anemia   . Seasonal allergies     Past Surgical History:  Procedure Laterality Date  . THYROID SURGERY Left 2004   removal of benign nodule  . wisdom teeth      Allergies: Codeine and Sulfa antibiotics  Medications: Prior to Admission medications   Medication Sig Start Date End Date Taking? Authorizing Provider  benzonatate (TESSALON) 100 MG capsule Take 1 capsule (100 mg total) by mouth 3 (three) times daily as needed for cough. 07/27/17  Yes Cross, Melissa D, NP  cetirizine (ZYRTEC)  10 MG tablet Take 10 mg by mouth every morning.   Yes [provider]  enoxaparin (LOVENOX) 80 MG/0.8ML injection Inject 0.8 mLs (80 mg total) into the skin every 12 (twelve) hours. 07/21/17  Yes Thurnell Lose, MD  ferrous sulfate 325 (65 FE) MG EC tablet Take 1 tablet (325 mg total) by mouth 2 (two) times daily. 07/21/17 08/20/17 Yes Thurnell Lose, MD  traMADol (ULTRAM) 50 MG tablet Take 1 tablet (50 mg total) by mouth every 6 (six) hours as needed for severe pain (Do not take and drive). 08/06/17  Yes [name] D, NP     Family History  Problem Relation Age of Onset  . Iron deficiency Mother   . Anemia Sister   . Iron deficiency Sister   . Goiter Sister   . Thyroid cancer Maternal Grandmother   . Bladder Cancer Maternal Grandfather   . Breast cancer Cousin        maternal 2nd cousin    Social History   Socioeconomic History  . Marital status: Single    Spouse name: Not on file  . Number of children: Not on file  . Years of education: Not on file  . Highest education level: Not on file  Occupational History  . Not on file  Social Needs  . Financial resource strain: Not on file  . Food insecurity:    Worry: Not on file    Inability: Not on file  . Transportation needs:    Medical: Not on file    Non-medical: Not on file  Tobacco Use  . Smoking  status: Never Smoker  . Smokeless tobacco: Never Used  Substance and Sexual Activity  . Alcohol use: Never    Frequency: Never  . Drug use: Never  . Sexual activity: Never  Lifestyle  . Physical activity:    Days per week: Not on file    Minutes per session: Not on file  . Stress: Not on file  Relationships  . Social connections:    Talks on phone: Not on file    Gets together: Not on file    Attends religious service: Not on file    Active member of club or organization: Not on file    Attends meetings of clubs or organizations: Not on file    Relationship status: Not on file  Other Topics Concern  . Not  on file  Social History Narrative  . Not on file     Review of Systems: A 12 point ROS discussed and pertinent positives are indicated in the HPI above.  All other systems are negative.  Review of Systems  Constitutional: Positive for activity change and fatigue. Negative for fever.  Respiratory: Negative for cough and shortness of breath.   Cardiovascular: Negative for chest pain.  Gastrointestinal: Negative for abdominal pain.  Psychiatric/Behavioral: Negative for behavioral problems and confusion.    Vital Signs: BP (!) 148/94   Pulse (!) 119   Temp 98.4 F (36.9 C) (Oral)   Ht 5\' 5"  (1.651 m)   Wt 180 lb (81.6 kg)   SpO2 98%   BMI 29.95 kg/m   Physical Exam  Constitutional: She is oriented to person, place, and time.  Cardiovascular: Normal rate, regular rhythm and normal heart sounds.  Pulmonary/Chest: Effort normal and breath sounds normal. She has no wheezes.  Abdominal: Soft. Bowel sounds are normal. There is no tenderness.  Musculoskeletal: Normal range of motion.  Neurological: She is alert and oriented to person, place, and time.  Skin: Skin is warm and dry.  Psychiatric: She has a normal mood and affect. Her behavior is normal. Judgment and thought content normal.  Nursing note and vitals reviewed.   Imaging: Dg Chest 2 View  Result Date: 07/20/2017 CLINICAL DATA:  Acute onset of generalized chest pain and shortness of breath. EXAM: CHEST - 2 VIEW COMPARISON:  None. FINDINGS: The lungs are well-aerated and clear. There is no evidence of focal opacification, pleural effusion or pneumothorax. The heart is borderline normal in size. No acute osseous abnormalities are seen. IMPRESSION: No acute cardiopulmonary process seen. Electronically Signed   By: Garald Balding M.D.   On: 07/20/2017 02:43   Ct Angio Chest Pe W And/or Wo Contrast  Result Date: 07/20/2017 CLINICAL DATA:  Shortness of breath EXAM: CT ANGIOGRAPHY CHEST WITH CONTRAST TECHNIQUE: Multidetector CT  imaging of the chest was performed using the standard protocol during bolus administration of intravenous contrast. Multiplanar CT image reconstructions and MIPs were obtained to evaluate the vascular anatomy. CONTRAST:  80 mL ISOVUE-370 IOPAMIDOL (ISOVUE-370) INJECTION 76% COMPARISON:  Chest radiograph July 20, 2017 FINDINGS: Cardiovascular: There are pulmonary emboli arising from the distal most aspect of the right main pulmonary artery extending into multiple right upper and lower lobe branches. There also pulmonary emboli in several left upper and lower lobe pulmonary artery branches. The right ventricle to left ventricle diameter ratio is less than 0.9, not consistent with right heart strain. There is no thoracic aortic aneurysm or dissection. The visualized great vessels appear normal. There is no pericardial effusion or pericardial thickening. Mediastinum/Nodes: Thyroid  appears unremarkable. There is no appreciable thoracic adenopathy. No esophageal lesions are evident. Lungs/Pleura: There is mild atelectatic change in the left base. No pulmonary infarcts are evident. No consolidation noted. In the apical segment of the right upper lobe, there is a nodular opacity measuring 7 x 5 mm, best seen on axial slice 24 series 6. There is no pleural effusion or pleural thickening evident. Upper Abdomen: Visualized upper abdominal structures appear normal. Musculoskeletal: There are no blastic or lytic bone lesions. No chest wall lesions evident. Review of the MIP images confirms the above findings. IMPRESSION: 1. Bilateral pulmonary emboli, somewhat or extensive on the right than on the left but extending into multiple upper and lower lobe pulmonary arterial vessels bilaterally. No demonstrable right heart strain. 2.  No thoracic aortic aneurysm or dissection. 3. 7 x 5 mm nodular opacity in the apical segment right upper lobe. Non-contrast chest CT at 6-12 months is recommended. If the nodule is stable at time of  repeat CT, then future CT at 18-24 months (from today's scan) is considered optional for low-risk patients, but is recommended for high-risk patients. This recommendation follows the consensus statement: Guidelines for Management of Incidental Pulmonary Nodules Detected on CT Images: From the Fleischner Society 2017; Radiology 2017; 284:228-243. 4.  No lung edema or consolidation.  Slight atelectasis left base. 5.  No appreciable thoracic adenopathy. Critical Value/emergent results were called by telephone at the time of interpretation on 07/20/2017 at 7:54 am to Dr. Jola Schmidt , who verbally acknowledged these results. Electronically Signed   By: Lowella Grip III M.D.   On: 07/20/2017 07:54   Mr Pelvis W Wo Contrast  Result Date: 07/20/2017 CLINICAL DATA:  Right ovarian mass on pelvic ultrasound. EXAM: MRI PELVIS WITHOUT AND WITH CONTRAST TECHNIQUE: Multiplanar multisequence MR imaging of the pelvis was performed both before and after administration of intravenous contrast. CONTRAST:  24mL MULTIHANCE GADOBENATE DIMEGLUMINE 529 MG/ML IV SOLN COMPARISON:  Pelvic ultrasound earlier the same date. Chest CT 07/20/2017. FINDINGS: Urinary Tract: The visualized distal ureters and bladder appear unremarkable. No evidence of hydroureter. Bowel: No bowel wall thickening, distention or surrounding inflammation identified within the pelvis. Mild sigmoid colon diverticular changes. Vascular/Lymphatic: No enlarged pelvic lymph nodes identified. No significant vascular findings. Reproductive: Uterus: Measures approximately 7.3 x 5.1 x 5.3 cm. The uterus is retroverted and retroflexed. There is irregular thickening of the junctional zone posteriorly, likely due to adenomyosis. The uterus enhances homogeneously following contrast, and no focal fibroids are demonstrated. Endometrium:  Normal in thickness without abnormal enhancement. Cervix/Vagina: Small cervical nabothian cysts. Otherwise unremarkable. Right ovary: As  demonstrated on ultrasound, there is a large complex right adnexal mass which is likely arising from the right ovary, not seen separately. This mass measures 9.3 x 7.8 x 8.4 cm and has cystic and solid enhancing components. There is a large solid enhancing component anteriorly, measuring 4.4 x 6.2 cm on image 11/16. Of note, this mass abuts the sigmoid colon, although does not clearly invade it. Left ovary:  The left ovary appears normal.  No adnexal mass. Other: There is a small amount of pelvic ascites within peritoneal enhancement. Peritoneal nodular enhancement identified. Musculoskeletal: No evidence of osseous metastatic disease or acute findings. Mild lumbar spondylosis. IMPRESSION: 1. 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 Surgical excision warranted. 2. Small amount of ascites within peritoneal enhancement. Mass abuts the sigmoid colon, although does not clearly invade it. No evidence of bowel or ureteral  obstruction. Abdominopelvic CT recommended to better evaluate the peritoneal cavity and relationship of the mass to the sigmoid colon. 3. Uterine adenomyosis. Electronically Signed   By: Richardean Sale M.D.   On: 07/20/2017 16:48   US Pelvis Transvanginal Non-ob (tv Only)  Result Date: 07/20/2017 CLINICAL DATA:  Anemia, heavy menses EXAM: TRANSABDOMINAL AND TRANSVAGINAL ULTRASOUND OF PELVIS TECHNIQUE: Both transabdominal and transvaginal ultrasound examinations of the pelvis were performed. Transabdominal technique was performed for global imaging of the pelvis including uterus, ovaries, adnexal regions, and pelvic cul-de-sac. It was necessary to proceed with endovaginal exam following the transabdominal exam to visualize the ovaries. COMPARISON:  None FINDINGS: Uterus Measurements: 8.7 x 6.3 x 4.9 cm. Heterogeneous echotexture. No focal fibroid or mass lesion. Endometrium Thickness: 8 mm in thickness, difficult to visualize due to the  heterogeneous uterus. No focal abnormality visualized. Right ovary Measurements: 9.7 x 8.3 x 8.6 cm. Complex cystic area measures 7.9 x 7.4 x 7.2 cm. There are nodular solid components along the wall. Left ovary Measurements: 3.3 x 2.2 x 2.2 cm. Normal appearance/no adnexal mass. Other findings Small amount of free fluid in the pelvis. IMPRESSION: Heterogeneous echotexture throughout the uterus which which can be seen with adenomyosis. Large complex cystic mass within the right ovary measuring up to 7.9 cm with mild mural nodularity. If not surgically excised, this could be further characterized with MRI. 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 Electronically Signed   By: Rolm Baptise M.D.   On: 07/20/2017 10:52   US Pelvis (transabdominal Only)  Result Date: 07/20/2017 CLINICAL DATA:  Anemia, heavy menses EXAM: TRANSABDOMINAL AND TRANSVAGINAL ULTRASOUND OF PELVIS TECHNIQUE: Both transabdominal and transvaginal ultrasound examinations of the pelvis were performed. Transabdominal technique was performed for global imaging of the pelvis including uterus, ovaries, adnexal regions, and pelvic cul-de-sac. It was necessary to proceed with endovaginal exam following the transabdominal exam to visualize the ovaries. COMPARISON:  None FINDINGS: Uterus Measurements: 8.7 x 6.3 x 4.9 cm. Heterogeneous echotexture. No focal fibroid or mass lesion. Endometrium Thickness: 8 mm in thickness, difficult to visualize due to the heterogeneous uterus. No focal abnormality visualized. Right ovary Measurements: 9.7 x 8.3 x 8.6 cm. Complex cystic area measures 7.9 x 7.4 x 7.2 cm. There are nodular solid components along the wall. Left ovary Measurements: 3.3 x 2.2 x 2.2 cm. Normal appearance/no adnexal mass. Other findings Small amount of free fluid in the pelvis. IMPRESSION: Heterogeneous echotexture throughout the uterus which which can be seen with adenomyosis. Large complex cystic mass within the  right ovary measuring up to 7.9 cm with mild mural nodularity. If not surgically excised, this could be further characterized with MRI. 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 Electronically Signed   By: Rolm Baptise M.D.   On: 07/20/2017 10:52   Ct Abdomen Pelvis W Contrast  Result Date: 08/01/2017 CLINICAL DATA:  Ovarian mass. EXAM: CT ABDOMEN AND PELVIS WITH CONTRAST TECHNIQUE: Multidetector CT imaging of the abdomen and pelvis was performed using the standard protocol following bolus administration of intravenous contrast. CONTRAST:  151mL ISOVUE-300 IOPAMIDOL (ISOVUE-300) INJECTION 61% COMPARISON:  CT scan of July 20, 2017. FINDINGS: Lower chest: No acute abnormality. Hepatobiliary: Multiple rounded hypodense lesions are noted throughout the liver most consistent with metastatic disease. The largest measures 8.2 x 5.1 cm in the inferior tip of right hepatic lobe. No gallstones are noted. No biliary dilatation is noted. Pancreas: Unremarkable. No pancreatic ductal dilatation or surrounding inflammatory  changes. Spleen: Normal in size without focal abnormality. Adrenals/Urinary Tract: Adrenal glands appear normal. Left kidney and ureter appear normal. Moderate right hydroureteronephrosis is noted which appears to be due to obstruction of distal ureter secondary to right ovarian mass. Stomach/Bowel: The stomach appears normal. There is no evidence of bowel obstruction. Vascular/Lymphatic: No significant vascular abnormality is noted. 1 cm right periaortic lymph node is noted concerning for metastatic disease. Reproductive: Uterus and left ovary are unremarkable. However, 10.3 x 8.5 cm complex mass is seen in right adnexal region with significant peripheral solid components consistent with ovarian carcinoma. This mass is seen to extend to the sigmoid colon and may be involving it. There is seen wall thickening involving the terminal ileum concerning for tumor involvement. Other:  Small amount of free fluid is noted in the pelvis. No definite hernia is noted. Musculoskeletal: No acute or significant osseous findings. 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 Potential involvement of adjacent sigmoid colon cannot be excluded. There is seen wall thickening involving the terminal ileum near this lesion suggesting metastatic involvement. Multiple hepatic lesions are noted consistent with metastatic disease. The largest measures 8 cm in diameter. 1 cm right periaortic lymph node is noted concerning for metastatic disease. Moderate right hydroureteronephrosis is noted which appears to be due to obstruction of the distal right ureter secondary to the right ovarian mass. Electronically Signed   By: Marijo Conception, M.D.   On: 08/01/2017 16:01   Dg Abd Portable 2v  Result Date: 07/21/2017 CLINICAL DATA:  Abdominal pain for several days. EXAM: PORTABLE ABDOMEN - 2 VIEW COMPARISON:  None. FINDINGS: The bowel gas pattern is normal. There is no evidence of free air. No radio-opaque calculi or other significant radiographic abnormality is seen. IMPRESSION: Negative. Electronically Signed   By: Earle Gell M.D.   On: 07/21/2017 16:23    Labs:  CBC: Recent Labs    07/20/17 0201 07/20/17 1412 07/21/17 0945  WBC 12.6* 12.1* 12.1*  HGB 5.1* 7.3* 9.4*  HCT 17.5* 23.8* 29.5*  PLT 315 261 273    COAGS: Recent Labs    07/20/17 0252  INR 1.15    BMP: Recent Labs    07/20/17 0201 07/21/17 0945  NA 137 138  K 4.1 4.0  CL 104 106  CO2 23 22  GLUCOSE 110* 96  BUN 8 6  CALCIUM 8.7* 8.5*  CREATININE 1.05* 0.91  GFRNONAA >60 >60  GFRAA >60 >60    LIVER FUNCTION TESTS: Recent Labs    07/20/17 0750  BILITOT 0.5  AST 23  ALT 18  ALKPHOS 97  PROT 7.0  ALBUMIN 3.1*    TUMOR MARKERS: No results for input(s): AFPTM, CEA, CA199, CHROMGRNA in the last 8760 hours.  Assessment  and Plan:  Hx B PE-- on Lovenox; LD 6/27  7am Ovarian mass; Lymphadenopathy; Pulmonary nodule; liver lesions Scheduled for liver lesion biopsy Risks and benefits discussed with the patient including, but not limited to bleeding, infection, damage to adjacent structures or low yield requiring additional tests. All of the patient's questions were answered, patient is agreeable to proceed. Consent signed and in chart.  Thank you for this interesting consult.  I greatly enjoyed meeting Brianna Wiley and look forward to participating in their care.  A copy of this report was sent to the requesting provider on this date.  Electronically Signed: Lavonia Drafts, PA-C 08/10/2017, 6:59 AM   I  spent a total of  30 Minutes   in face to face in clinical consultation, greater than 50% of which was counseling/coordinating care for liver lesion bx

## 2017-08-10 NOTE — Sedation Documentation (Signed)
Patient denies pain and is resting comfortably.  

## 2017-08-10 NOTE — Sedation Documentation (Addendum)
Patient is resting comfortably. 

## 2017-08-10 NOTE — Discharge Instructions (Addendum)
Liver Biopsy, Care After These instructions give you information on caring for yourself after your procedure. Your doctor may also give you more specific instructions. Call your doctor if you have any problems or questions after your procedure. Follow these instructions at home:  Rest at home for 1-2 days or as told by your doctor.  Have someone stay with you for at least 24 hours.  Do not do these things in the first 24 hours: ? Drive. ? Use machinery. ? Take care of other people. ? Sign legal documents. ? Take a bath or shower.  There are many different ways to close and cover a cut (incision). For example, a cut can be closed with stitches, skin glue, or adhesive strips. Follow your doctor's instructions on: ? Taking care of your cut. ? Changing and removing your bandage (dressing). ? Removing whatever was used to close your cut.  Do not drink alcohol in the first week.  Do not lift more than 5 pounds or play contact sports for the first 2 weeks.  Take medicines only as told by your doctor. For 1 week, do not take medicine that has aspirin in it or medicines like ibuprofen.  Get your test results. Contact a doctor if:  A cut bleeds and leaves more than just a small spot of blood.  A cut is red, puffs up (swells), or hurts more than before.  Fluid or something else comes from a cut.  A cut smells bad.  You have a fever or chills. Get help right away if:  You have swelling, bloating, or pain in your belly (abdomen).  You get dizzy or faint.  You have a rash.  You feel sick to your stomach (nauseous) or throw up (vomit).  You have trouble breathing, feel short of breath, or feel faint.  Your chest hurts.  You have problems talking or seeing.  You have trouble balancing or moving your arms or legs. This information is not intended to replace advice given to you by your health care provider. Make sure you discuss any questions you have with your health care  provider. Document Released: 11/09/2007 Document Revised: 07/08/2015 Document Reviewed: 03/28/2013 Elsevier Interactive Patient Education  2018 [place]. Moderate Conscious Sedation, Adult, Care After These instructions provide you with information about caring for yourself after your procedure. Your health care provider may also give you more specific instructions. Your treatment has been planned according to current medical practices, but problems sometimes occur. Call your health care provider if you have any problems or questions after your procedure. What can I expect after the procedure? After your procedure, it is common:  To feel sleepy for several hours.  To feel clumsy and have poor balance for several hours.  To have poor judgment for several hours.  To vomit if you eat too soon.  Follow these instructions at home: For at least 24 hours after the procedure:   Do not: ? Participate in activities where you could fall or become injured. ? Drive. ? Use heavy machinery. ? Drink alcohol. ? Take sleeping pills or medicines that cause drowsiness. ? Make important decisions or sign legal documents. ? Take care of children on your own.  Rest. Eating and drinking  Follow the diet recommended by your health care provider.  If you vomit: ? Drink water, juice, or soup when you can drink without vomiting. ? Make sure you have little or no nausea before eating solid foods. General instructions  Have a responsible  adult stay with you until you are awake and alert.  Take over-the-counter and prescription medicines only as told by your health care provider.  If you smoke, do not smoke without supervision.  Keep all follow-up visits as told by your health care provider. This is important. Contact a health care provider if:  You keep feeling nauseous or you keep vomiting.  You feel light-headed.  You develop a rash.  You have a fever. Get help right away if:  You  have trouble breathing. This information is not intended to replace advice given to you by your health care provider. Make sure you discuss any questions you have with your health care provider. Document Released: 11/20/2012 Document Revised: 07/05/2015 Document Reviewed: 05/22/2015 Elsevier Interactive Patient Education  2018 [employer]. Liver Biopsy, Care After These instructions give you information on caring for yourself after your procedure. Your doctor may also give you more specific instructions. Call your doctor if you have any problems or questions after your procedure. Follow these instructions at home:  Rest at home for 1-2 days or as told by your doctor.  Have someone stay with you for at least 24 hours.  Do not do these things in the first 24 hours: ? Drive. ? Use machinery. ? Take care of other people. ? Sign legal documents. ? Take a bath or shower.  There are many different ways to close and cover a cut (incision). For example, a cut can be closed with stitches, skin glue, or adhesive strips. Follow your doctor's instructions on: ? Taking care of your cut. ? Changing and removing your bandage (dressing). ? Removing whatever was used to close your cut.  Do not drink alcohol in the first week.  Do not lift more than 5 pounds or play contact sports for the first 2 weeks.  Take medicines only as told by your doctor. For 1 week, do not take medicine that has aspirin in it or medicines like ibuprofen.  Get your test results. Contact a doctor if:  A cut bleeds and leaves more than just a small spot of blood.  A cut is red, puffs up (swells), or hurts more than before.  Fluid or something else comes from a cut.  A cut smells bad.  You have a fever or chills. Get help right away if:  You have swelling, bloating, or pain in your belly (abdomen).  You get dizzy or faint.  You have a rash.  You feel sick to your stomach (nauseous) or throw up (vomit).  You  have trouble breathing, feel short of breath, or feel faint.  Your chest hurts.  You have problems talking or seeing.  You have trouble balancing or moving your arms or legs. This information is not intended to replace advice given to you by your health care provider. Make sure you discuss any questions you have with your health care provider. Document Released: 11/09/2007 Document Revised: 07/08/2015 Document Reviewed: 03/28/2013 Elsevier Interactive Patient Education  [employer].

## 2017-08-10 NOTE — Sedation Documentation (Signed)
Patient is resting comfortably. 

## 2017-08-10 NOTE — Progress Notes (Signed)
Patient states that she is not sexually active. Refuses pregnacy test. States "I don't need it"

## 2017-08-10 NOTE — Procedures (Signed)
  Procedure: US liver lesion core biopsy 18g x3 EBL:   minimal Complications:  none immediate  See full dictation in [facility] P[facility].  D. Kaelynne Christley MD Main # [phone] Pager  [phone]    

## 2017-08-13 ENCOUNTER — Telehealth: Payer: Self-pay | Admitting: *Deleted

## 2017-08-13 NOTE — Telephone Encounter (Signed)
Patient called stating " I am very constipated, I have not had a bowel movement since last Tuesday".  "I also vomited last night".  I spoke with [name] NP, per [name], NP increase stool softener to twice a day, Take  Miralax twice a day,and also do a Fleets suppository.  Patient verbalized understanding.  I told patient to call our office back if she had any further issues. Patient verbalized understanding.

## 2017-08-14 ENCOUNTER — Other Ambulatory Visit: Payer: Self-pay | Admitting: Gynecologic Oncology

## 2017-08-14 ENCOUNTER — Telehealth: Payer: Self-pay | Admitting: Gynecologic Oncology

## 2017-08-14 DIAGNOSIS — R1031 Right lower quadrant pain: Secondary | ICD-10-CM

## 2017-08-14 MED ORDER — TRAMADOL HCL 50 MG PO TABS: 50.0000 mg | ORAL_TABLET | Freq: Four times a day (QID) | ORAL | 0 refills | Status: DC | PRN

## 2017-08-15 NOTE — Telephone Encounter (Signed)
Spoke with the patient about her biopsy results and about an appointment with Dr. Alvy Bimler.  Patient had multiple questions about stage, prognosis, why surgery was not an option.  All questions answered.  Patient still seemed overwhelmed at the end of the call. Advised that I would let Dr. Gerarda Fraction know about our conversation and have her give the patient a call as well.

## 2017-08-17 ENCOUNTER — Inpatient Hospital Stay: Payer: BLUE CROSS/BLUE SHIELD | Attending: Hematology and Oncology | Admitting: Hematology and Oncology

## 2017-08-17 ENCOUNTER — Telehealth: Payer: Self-pay | Admitting: *Deleted

## 2017-08-17 ENCOUNTER — Telehealth: Payer: Self-pay

## 2017-08-17 NOTE — Telephone Encounter (Signed)
Brianna Wiley is having RL Abdominal pain.  It has increased in the last ~24 hours to a 10/10. The 50 mg of Tramadol is not effective. Pt cancelling new patient appointment for today with Dr. Alvy Bimler as she did not sleep well and does not feel up to keeping appointment. Stressed with patient that the pain may be from increased pressure of the mass on her right ureter as seen on CT scan 08-01-17.  Dr. Alvy Bimler can address underlying causes for pain and address pain management. Brianna Wiley stated that she understood the importance for follow up but cannot come in today and wants to r/s appointment to next week. Told her that she can take Tramadol 100 mg every 6 hours as needed for pain per Dr. Alvy Bimler. If pain not improved with increased tramadol or is more severe, she should go the the [facility] ED.

## 2017-08-17 NOTE — Telephone Encounter (Signed)
Patient called stating " I have been up all night in pain".  " The tramadol is not working and I need something stronger".  Patient states " I am hurting on my right side between the lower abd area and my vaginal area".  I told the patient that I would have to page the doctor and get back with her.  Patient verbalized understanding.

## 2017-08-17 NOTE — Telephone Encounter (Signed)
Pt states she needs to reschedule her appt with Dr Alvy Bimler to next week because she does not feel well and did not get any sleep. Encouraged her to come see today Dr Alvy Bimler as she might be able to help. Pt states she wants to reschedule.

## 2017-08-19 ENCOUNTER — Other Ambulatory Visit: Payer: Self-pay | Admitting: Gynecologic Oncology

## 2017-08-19 DIAGNOSIS — R1031 Right lower quadrant pain: Secondary | ICD-10-CM

## 2017-08-20 ENCOUNTER — Other Ambulatory Visit: Payer: Self-pay | Admitting: Gynecologic Oncology

## 2017-08-20 ENCOUNTER — Telehealth: Payer: Self-pay | Admitting: Gynecologic Oncology

## 2017-08-20 DIAGNOSIS — G893 Neoplasm related pain (acute) (chronic): Secondary | ICD-10-CM

## 2017-08-20 DIAGNOSIS — Z3A11 11 weeks gestation of pregnancy: Secondary | ICD-10-CM

## 2017-08-20 MED ORDER — TRAMADOL HCL 50 MG PO TABS: 100.0000 mg | ORAL_TABLET | Freq: Four times a day (QID) | ORAL | 0 refills | Status: AC | PRN

## 2017-08-20 NOTE — Progress Notes (Signed)
Patient called stating she is taking two tramadol every six hours for pain with her pain level staying around a 6 from a 10 previously.  She cancelled her appt with Dr. Alvy Bimler on Friday.  Her next available opening would be the 15th and the patient states she cannot do the 15th but could do the 17th.  Appt will be made.  Advised she would be prescribed enough pain medication to get her to that appt and stressed the importance of coming to that appt since a cancer has been confirmed.

## 2017-08-20 NOTE — Telephone Encounter (Signed)
See orders only note

## 2017-08-21 ENCOUNTER — Encounter: Payer: Self-pay | Admitting: Hematology and Oncology

## 2017-08-21 DIAGNOSIS — C561 Malignant neoplasm of right ovary: Secondary | ICD-10-CM | POA: Insufficient documentation

## 2017-08-22 ENCOUNTER — Telehealth: Payer: Self-pay | Admitting: Obstetrics

## 2017-08-22 NOTE — Telephone Encounter (Signed)
FAXED RECORDS TO [facility] RELEASE ID [id number]

## 2017-08-29 ENCOUNTER — Inpatient Hospital Stay: Payer: BLUE CROSS/BLUE SHIELD | Admitting: Hematology and Oncology

## 2017-08-29 ENCOUNTER — Telehealth: Payer: Self-pay | Admitting: *Deleted

## 2017-08-29 NOTE — Telephone Encounter (Signed)
Patient called last night and left a voicemail message. The patient called the new patient appt with Dr. Alvy Bimler. Patient is in [place] at [place], and will begin treatment there today.

## 2017-09-06 ENCOUNTER — Institutional Professional Consult (permissible substitution): Payer: BLUE CROSS/BLUE SHIELD | Admitting: Internal Medicine

## 2018-01-13 DEATH — deceased

## 2018-09-28 IMAGING — DX DG ABD PORTABLE 2V
1 series · 2 of 2 positions shown · non-contrast
Comparison: None.

CLINICAL DATA: Abdominal pain for several days.

EXAM:
PORTABLE ABDOMEN - 2 VIEW

[Series 1: abdomen · 0.14mm/px · 2 of 2 slices shown]
[im 1/2]
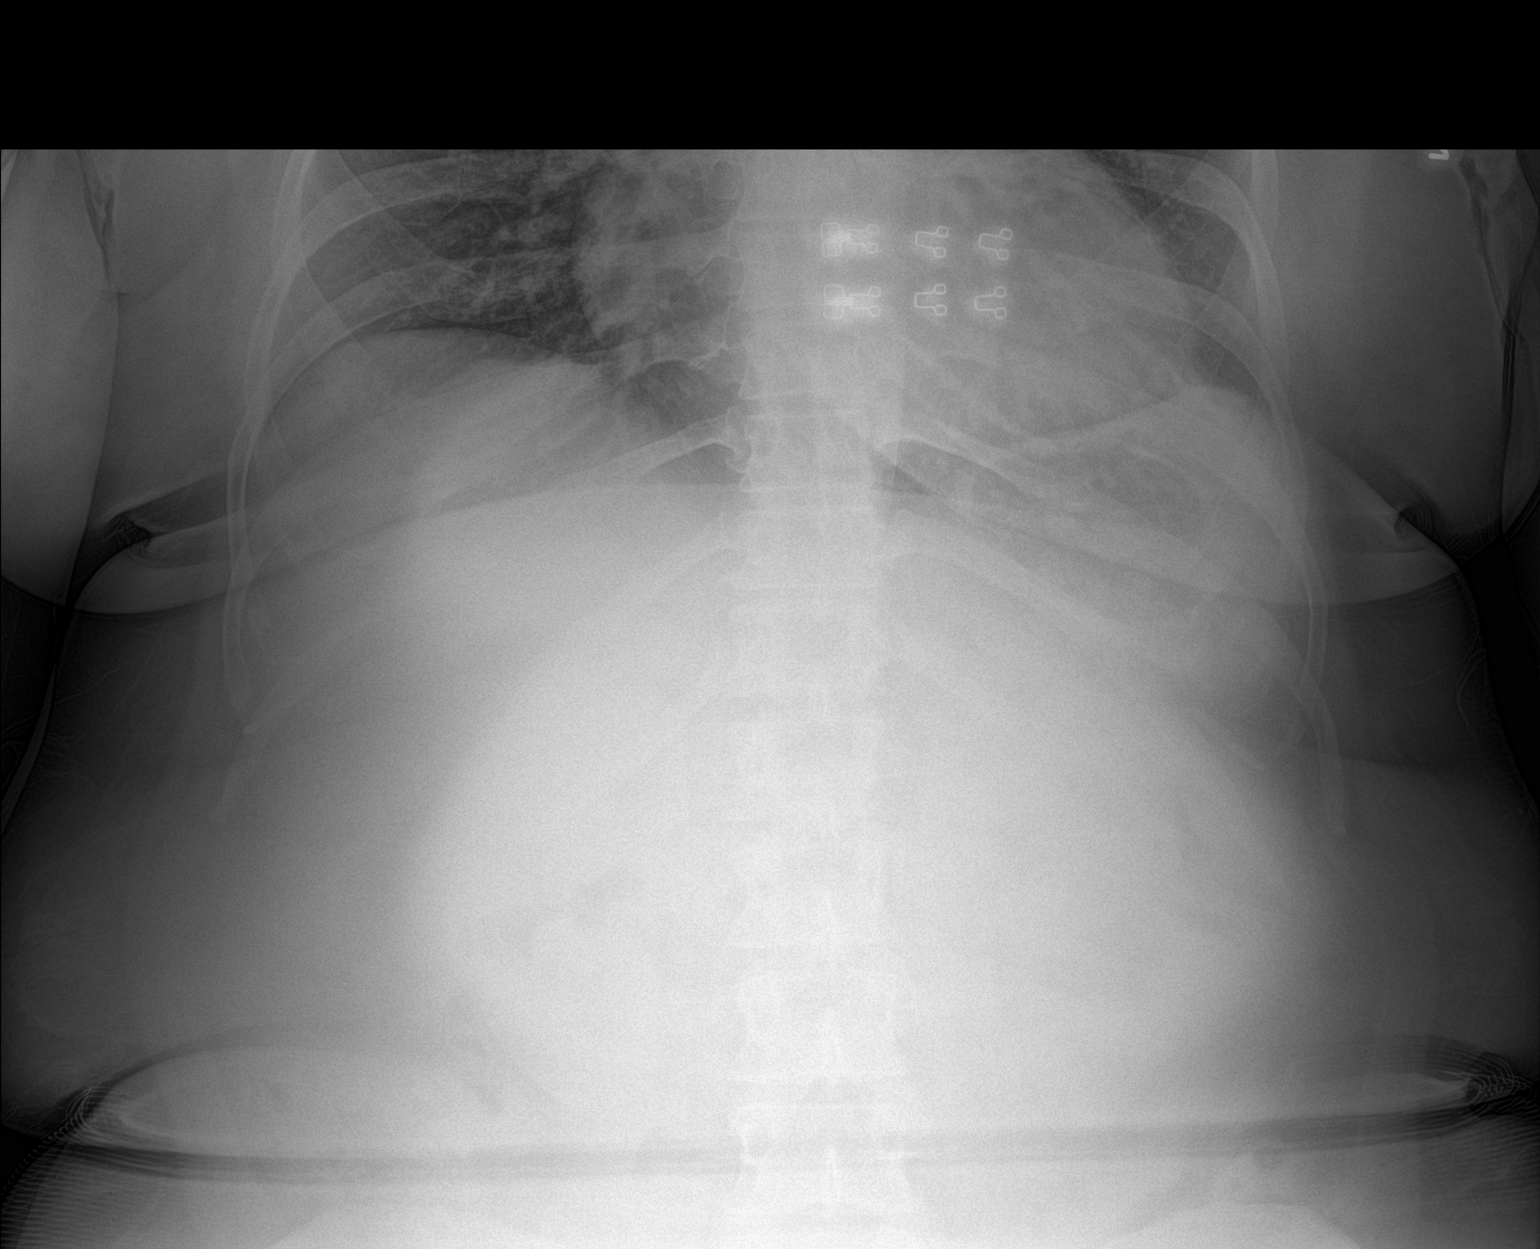
[im 2/2]
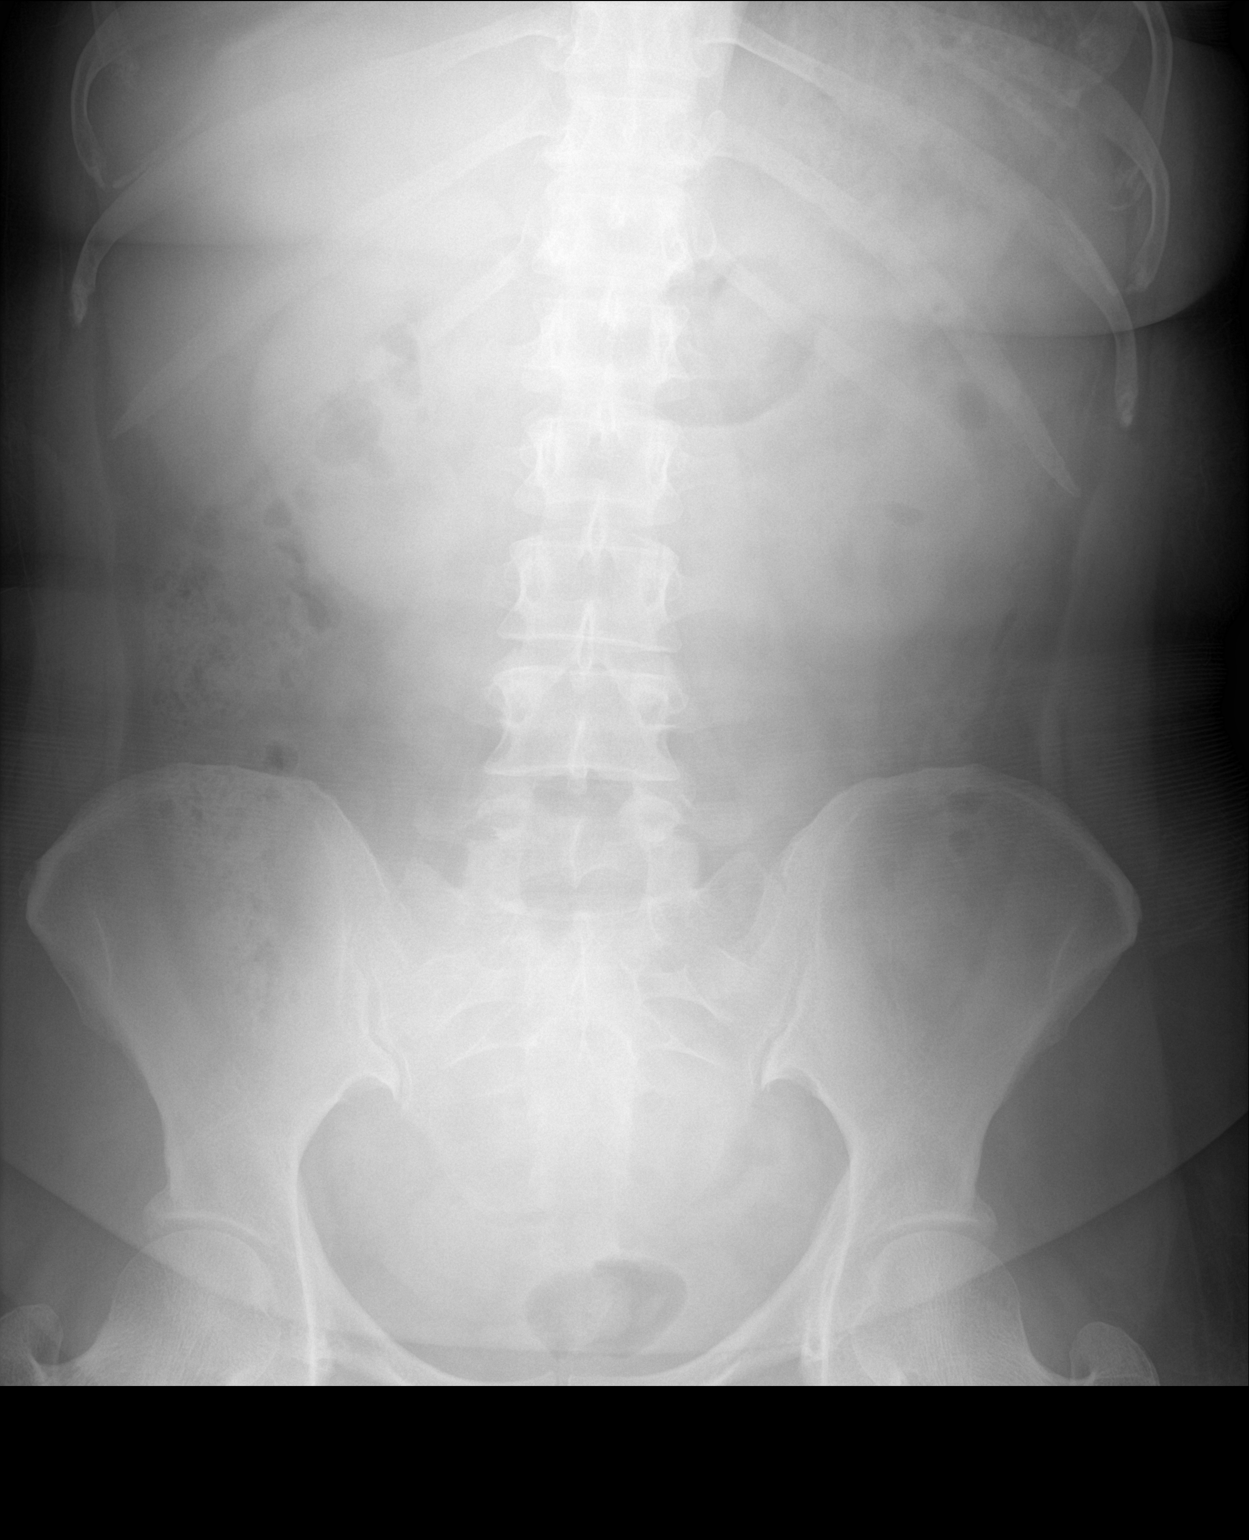

[2 of 2 positions shown; findings below may reference images not displayed]

FINDINGS: The bowel gas pattern is normal. There is no evidence of free air.
No radio-opaque calculi or other significant radiographic
abnormality is seen.
IMPRESSION: Negative.

## 2018-10-09 IMAGING — CT CT ABD-PELV W/ CM
2 of 5 series · 15 of 46 positions shown, 17 images · IV contrast (ISOVUE 300)
Comparison: CT scan of July 20, 2017.

CLINICAL DATA: Ovarian mass.

EXAM:
CT ABDOMEN AND PELVIS WITH CONTRAST
TECHNIQUE: Multidetector CT imaging of the abdomen and pelvis was performed
using the standard protocol following bolus administration of
intravenous contrast.
CONTRAST:  100mL 8UTAR6-QRR IOPAMIDOL (8UTAR6-QRR) INJECTION 61%

[Series 2: axial st · axial · 0.79mm/px · z∈[+1109,+1499]mm · 12 of 90 slices shown, 14 images]
[im 6/90  soft-tissue]
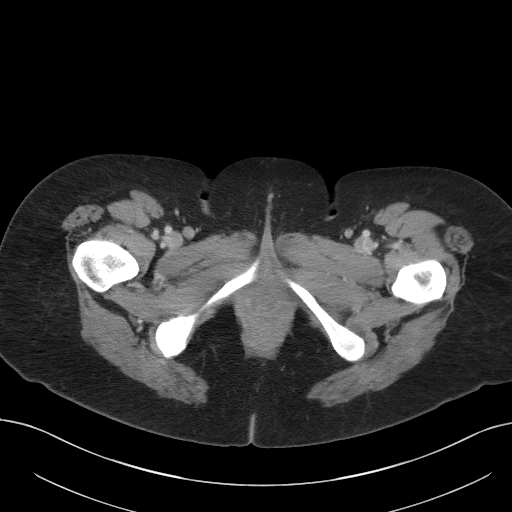
[im 6/90  bone]
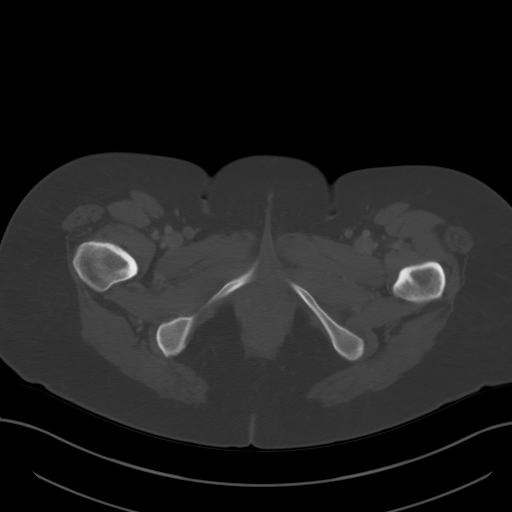
[im 12/90  soft-tissue]
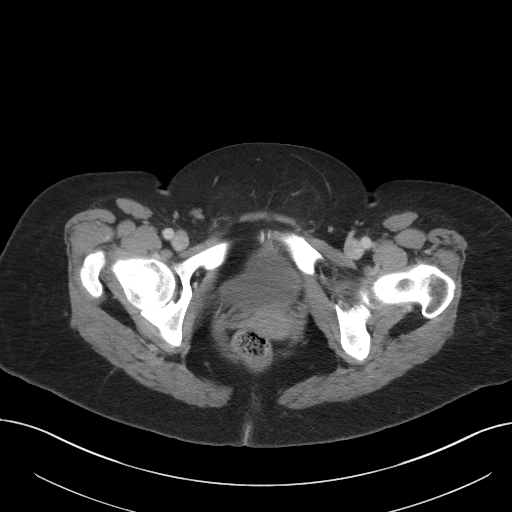
[im 23/90  soft-tissue]
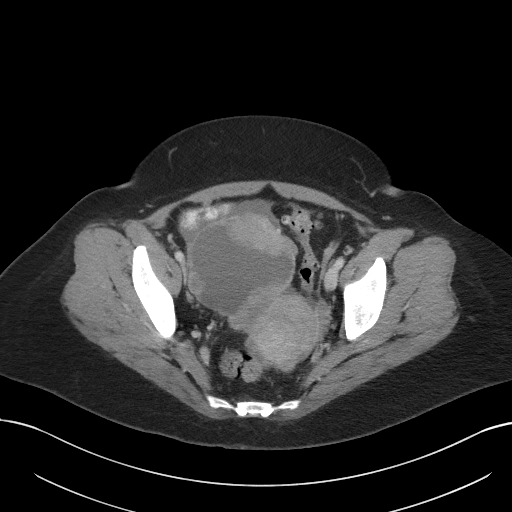
[im 28/90  soft-tissue]
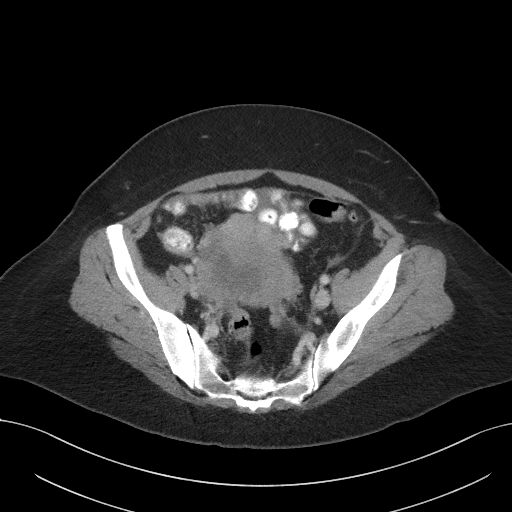
[im 34/90  soft-tissue]
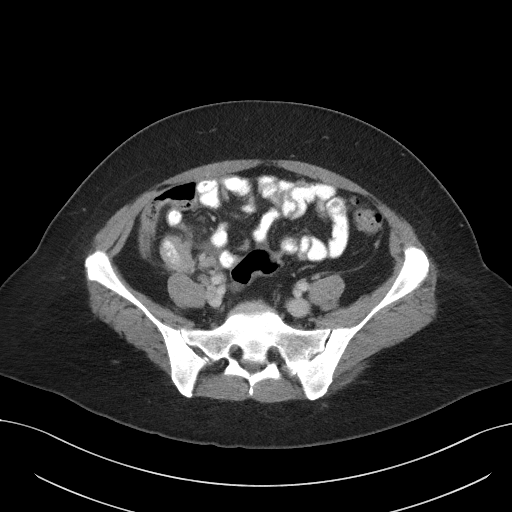
[im 39/90  soft-tissue]
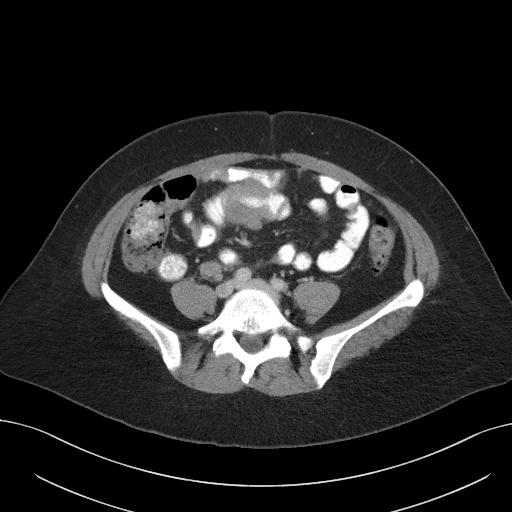
[im 51/90  soft-tissue]
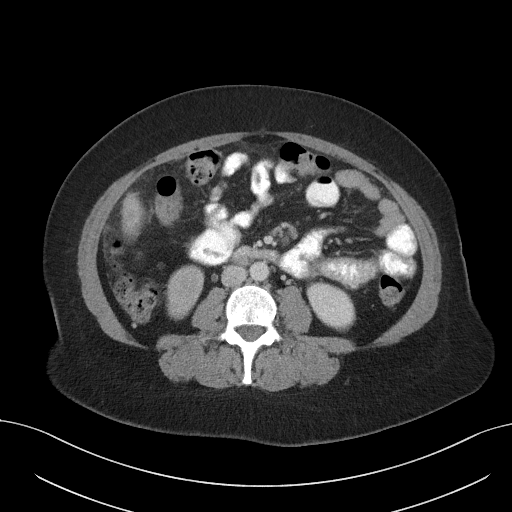
[im 56/90  soft-tissue]
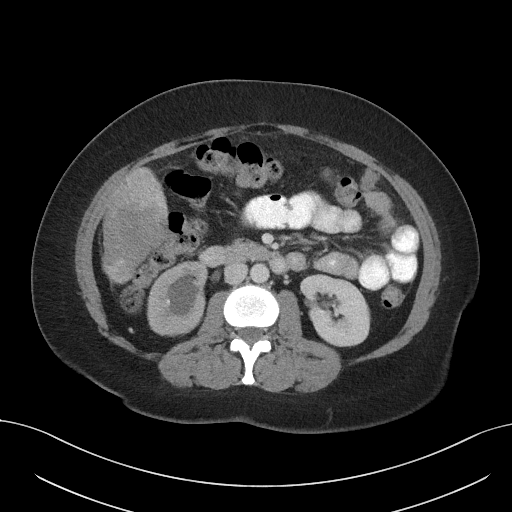
[im 62/90  soft-tissue]
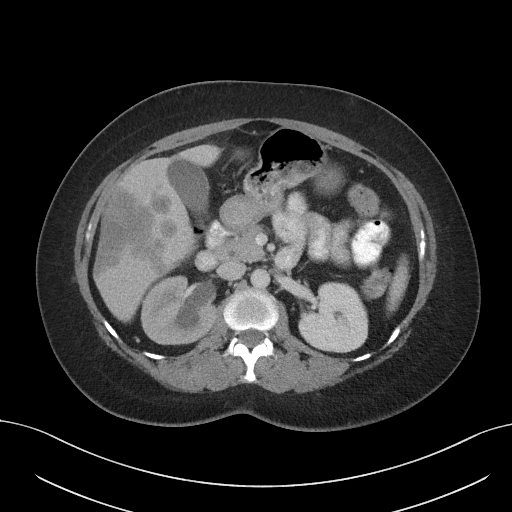
[im 62/90  bone]
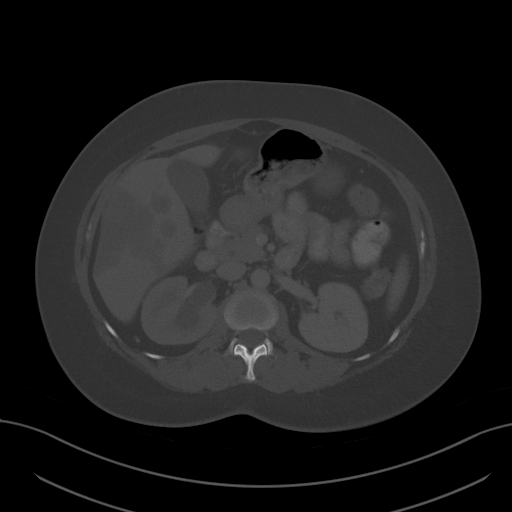
[im 67/90  soft-tissue]
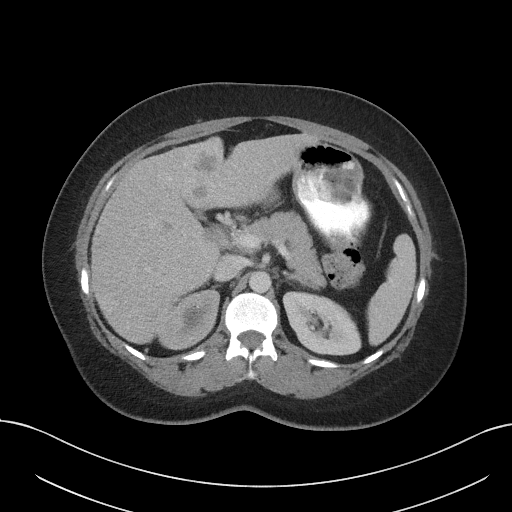
[im 78/90  soft-tissue]
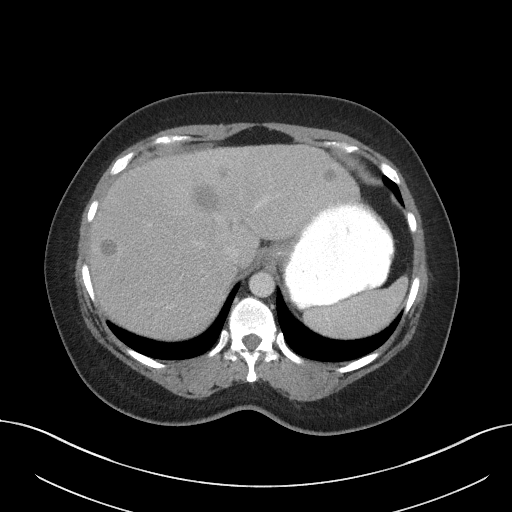
[im 84/90  soft-tissue]
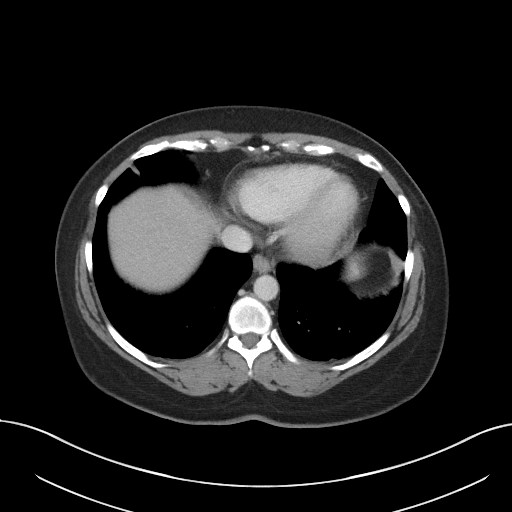

[Series 5: coronal st · coronal · 0.73mm/px · 3 of 96 slices shown]
[im 32/96  soft-tissue]
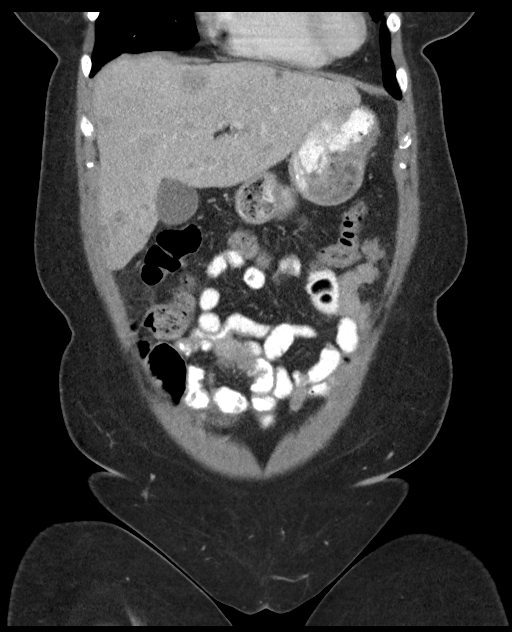
[im 43/96  soft-tissue]
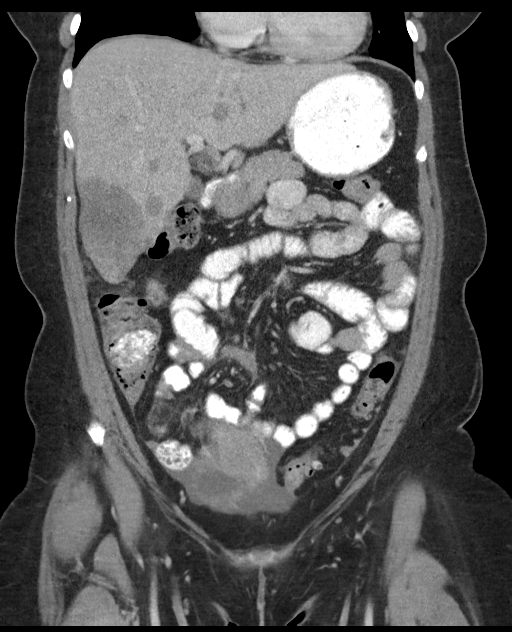
[im 53/96  soft-tissue]
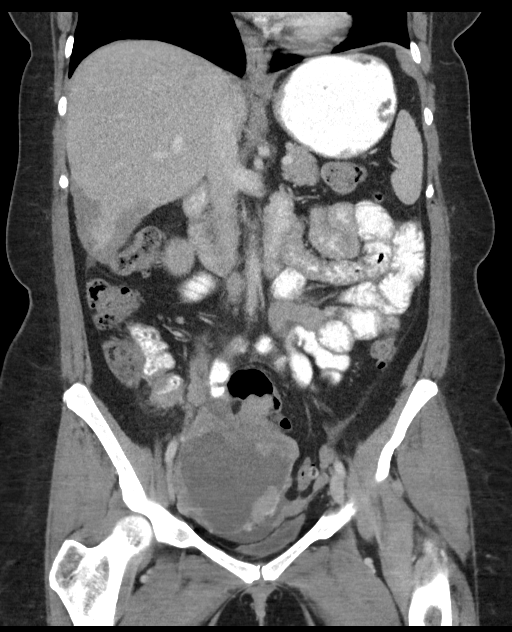

[15 of 46 positions shown; findings below may reference images not displayed]

FINDINGS: Lower chest: No acute abnormality.

Hepatobiliary: Multiple rounded hypodense lesions are noted
throughout the liver most consistent with metastatic disease. The
largest measures 8.2 x 5.1 cm in the inferior tip of right hepatic
lobe. No gallstones are noted. No biliary dilatation is noted.

Pancreas: Unremarkable. No pancreatic ductal dilatation or
surrounding inflammatory changes.

Spleen: Normal in size without focal abnormality.

Adrenals/Urinary Tract: Adrenal glands appear normal. Left kidney
and ureter appear normal. Moderate right hydroureteronephrosis is
noted which appears to be due to obstruction of distal ureter
secondary to right ovarian mass.

Stomach/Bowel: The stomach appears normal. There is no evidence of
bowel obstruction.

Vascular/Lymphatic: No significant vascular abnormality is noted. 1
cm right periaortic lymph node is noted concerning for metastatic
disease.

Reproductive: Uterus and left ovary are unremarkable. However,
x 8.5 cm complex mass is seen in right adnexal region with
significant peripheral solid components consistent with ovarian
carcinoma. This mass is seen to extend to the sigmoid colon and may
be involving it. There is seen wall thickening involving the
terminal ileum concerning for tumor involvement.

Other: Small amount of free fluid is noted in the pelvis. No
definite hernia is noted.

Musculoskeletal: No acute or significant osseous findings.
IMPRESSION: 10.3 x 8.5 cm complex right adnexal mass is noted with significant
peripheral solid components concerning for ovarian carcinoma.
Potential involvement of adjacent sigmoid colon cannot be excluded.
There is seen wall thickening involving the terminal ileum near this
lesion suggesting metastatic involvement.

Multiple hepatic lesions are noted consistent with metastatic
disease. The largest measures 8 cm in diameter.

1 cm right periaortic lymph node is noted concerning for metastatic
disease.

Moderate right hydroureteronephrosis is noted which appears to be
due to obstruction of the distal right ureter secondary to the right
ovarian mass.

## 2019-06-26 IMAGING — US US PELVIS COMPLETE
1 series · 13 of 25 positions shown · non-contrast
Comparison: None

CLINICAL DATA: Anemia, heavy menses



[Series 1: us pelvis complete · 0.27mm/px · 13 of 64 slices shown]
[im 1/64]
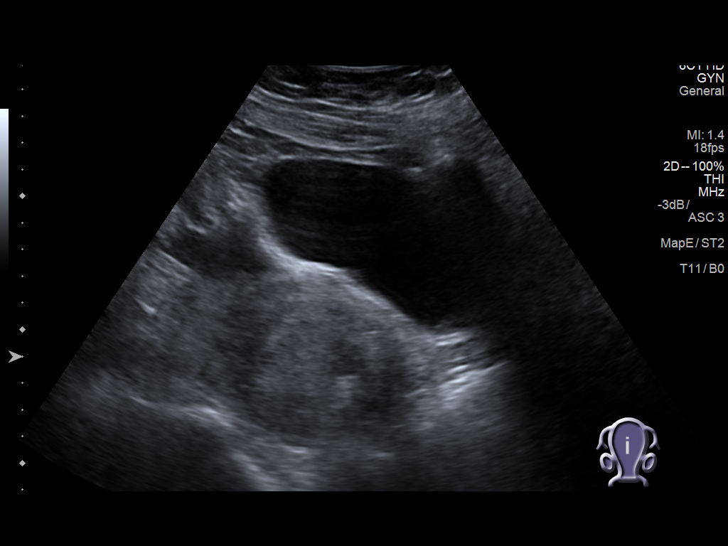
[im 6/64]
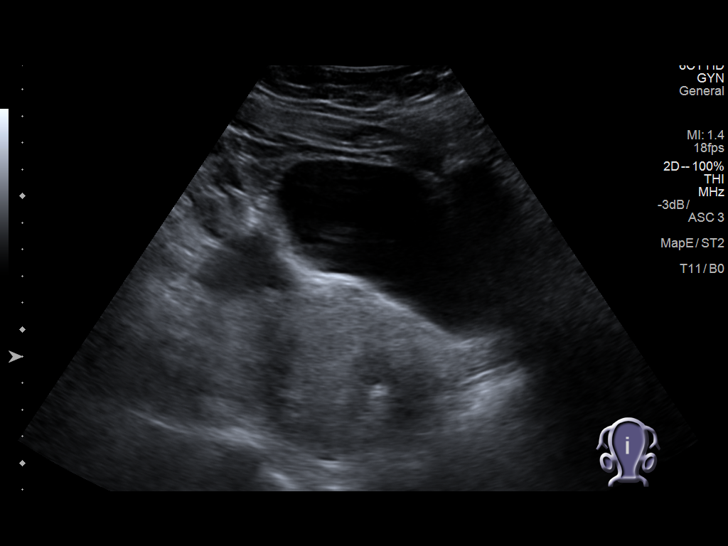
[im 11/64]
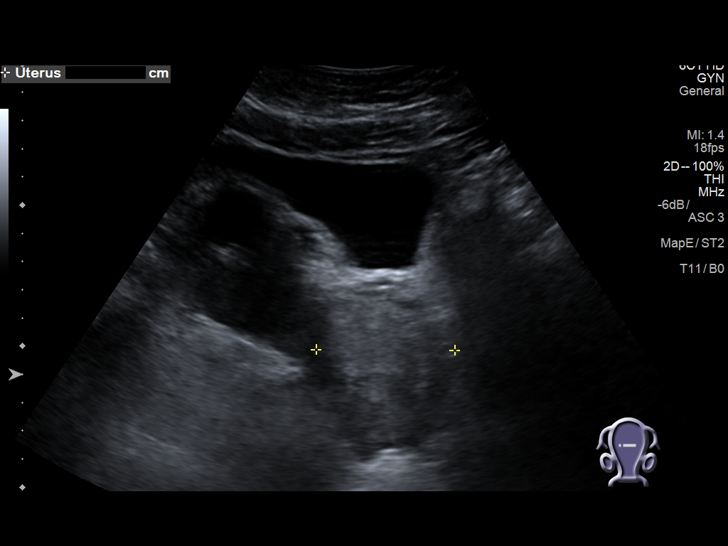
[im 16/64]
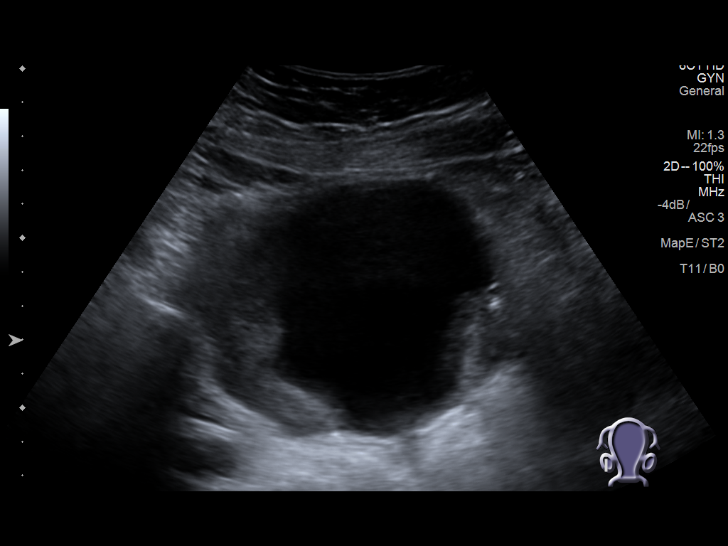
[im 22/64]
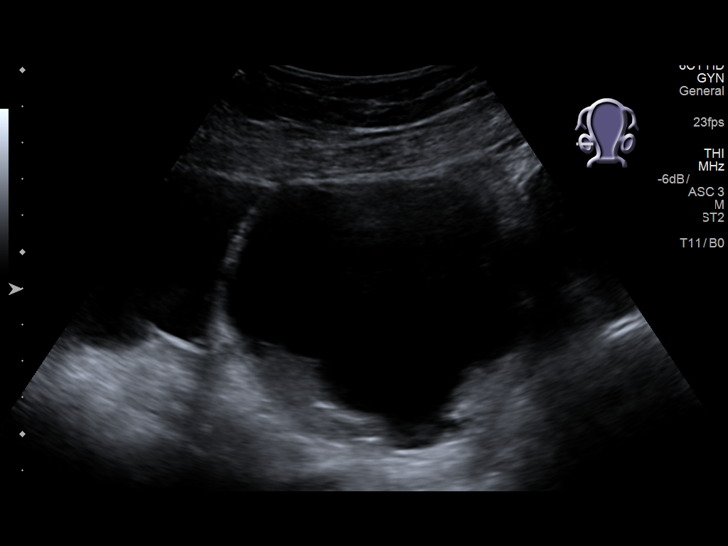
[im 27/64]
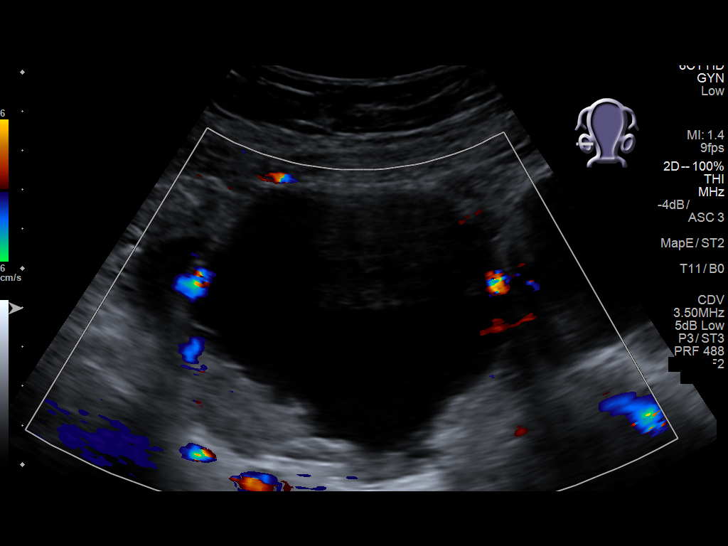
[im 32/64]
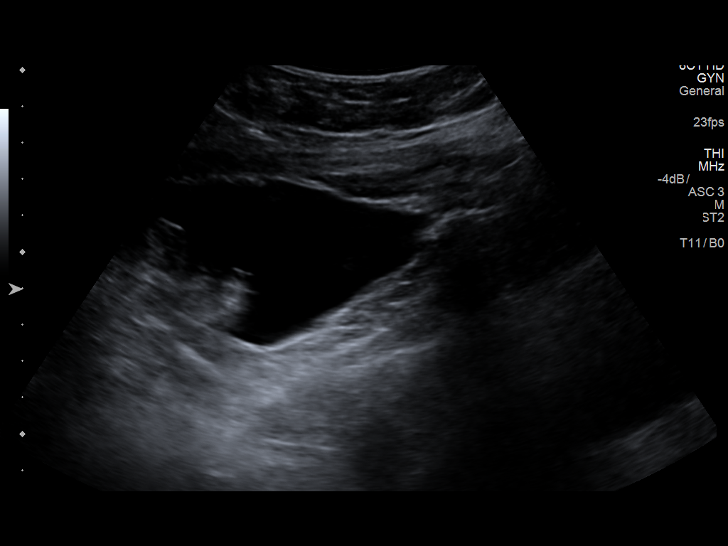
[im 37/64]
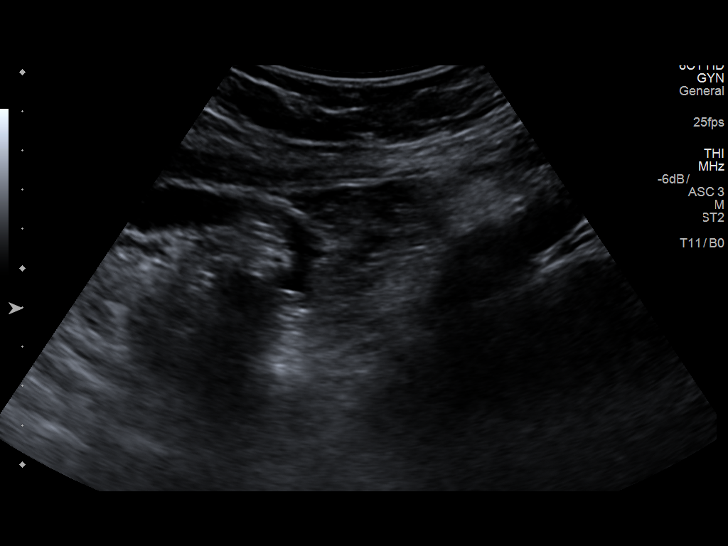
[im 43/64]
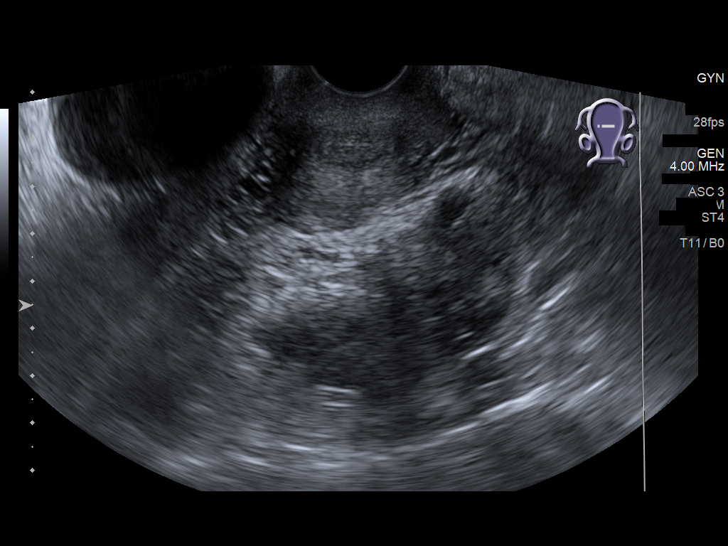
[im 48/64]
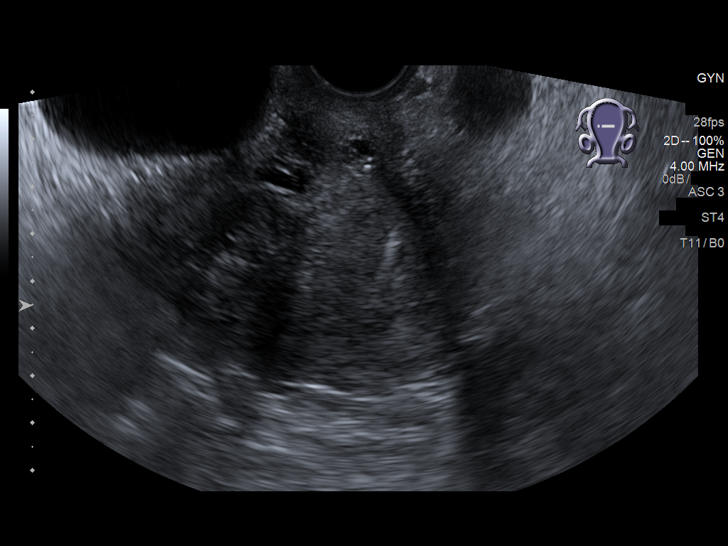
[im 53/64]
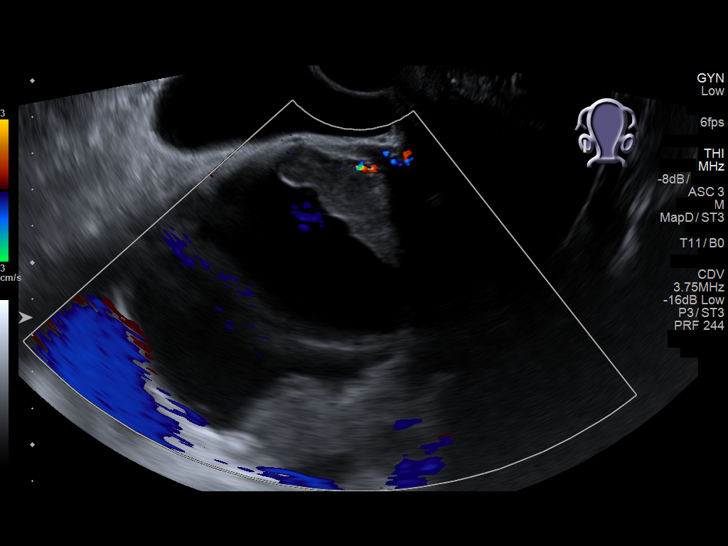
[im 58/64]
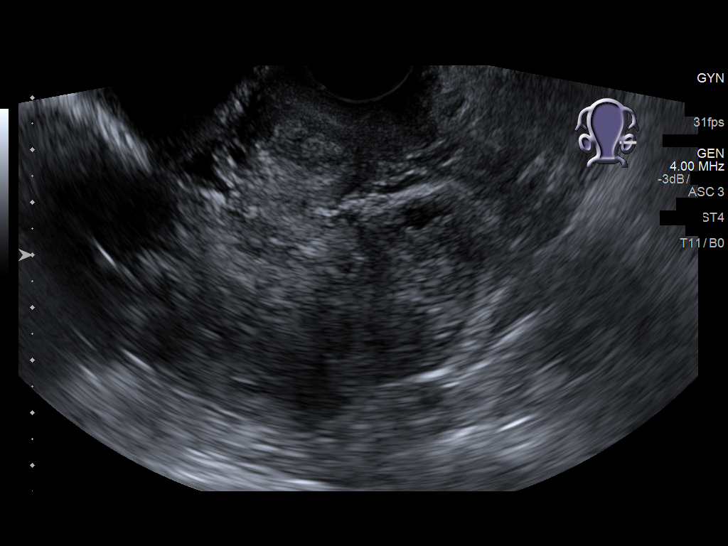
[im 64/64]
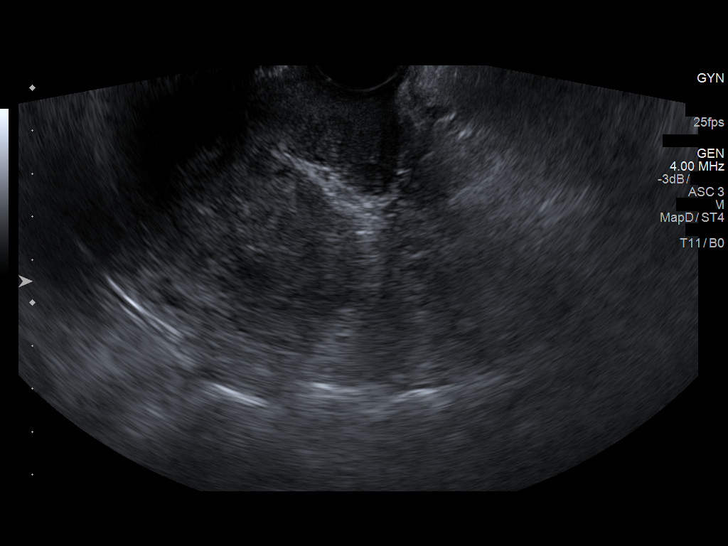

[13 of 25 positions shown; findings below may reference images not displayed]

FINDINGS: Uterus

Measurements: 8.7 x 6.3 x 4.9 cm. Heterogeneous echotexture. No
focal fibroid or mass lesion.

Endometrium

Thickness: 8 mm in thickness, difficult to visualize due to the
heterogeneous uterus. No focal abnormality visualized.

Right ovary

Measurements: 9.7 x 8.3 x 8.6 cm. Complex cystic area measures 7.9 x
7.4 x 7.2 cm. There are nodular solid components along the wall.

Left ovary

Measurements: 3.3 x 2.2 x 2.2 cm. Normal appearance/no adnexal mass.

Other findings

Small amount of free fluid in the pelvis.
IMPRESSION: Heterogeneous echotexture throughout the uterus which which can be
seen with adenomyosis.

Large complex cystic mass within the right ovary measuring up to
cm with mild mural nodularity. If not surgically excised, this could
be further characterized with MRI. This most likely reflects a
cystic ovarian neoplasm, benign or malignant.

## 2019-07-17 IMAGING — US US BIOPSY CORE LIVER
1 series · 12 of 12 positions shown · non-contrast
Comparison: none

CLINICAL DATA: Ovarian mass. Multiple liver lesions. Biopsy
requested

EXAM:
ULTRASOUND-GUIDED CORE LIVER BIOPSY
TECHNIQUE: An ultrasound guided liver biopsy was thoroughly discussed with the
patient and questions were answered. The benefits, risks,
alternatives, and complications were also discussed. The patient
understands and wishes to proceed with the procedure. A verbal as
well as written consent was obtained.

[Series 1: us biopsy core liver · 0.23mm/px · 12 of 12 slices shown]
[im 1/12]
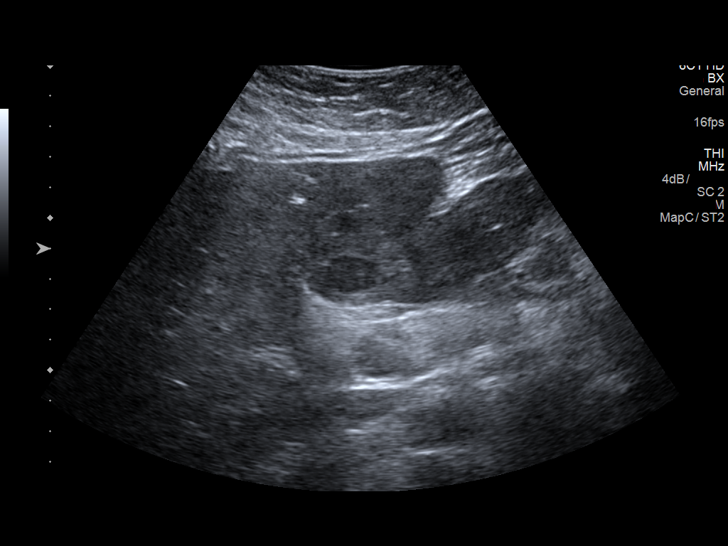
[im 2/12]
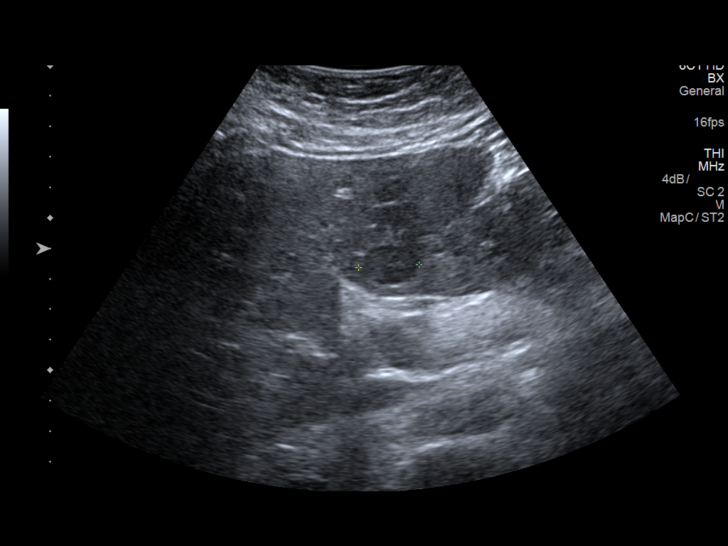
[im 3/12]
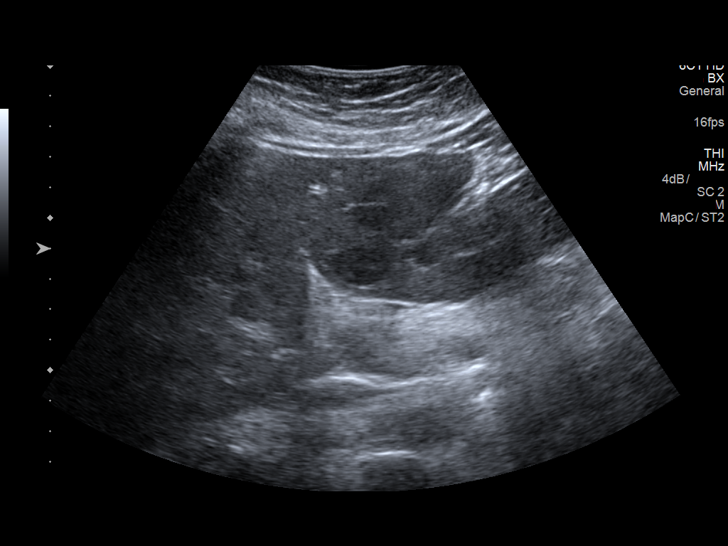
[im 4/12]
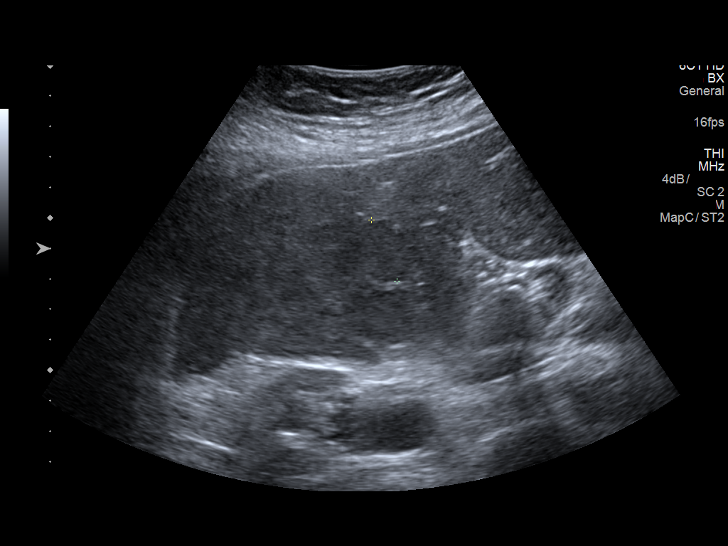
[im 5/12]
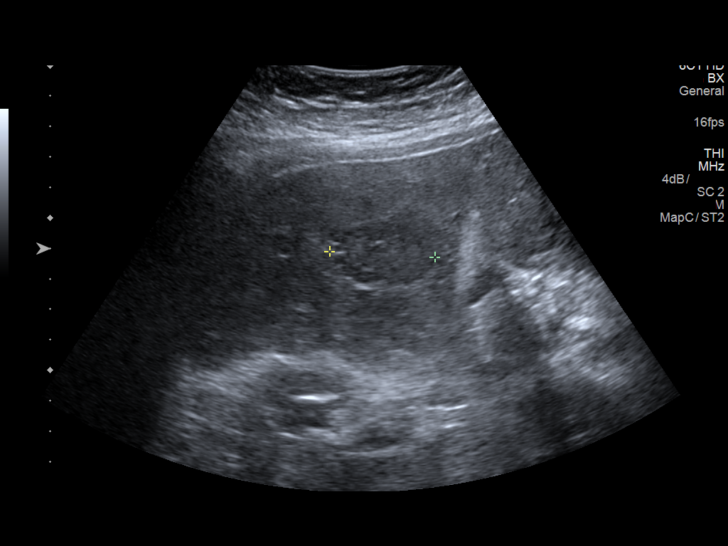
[im 6/12]
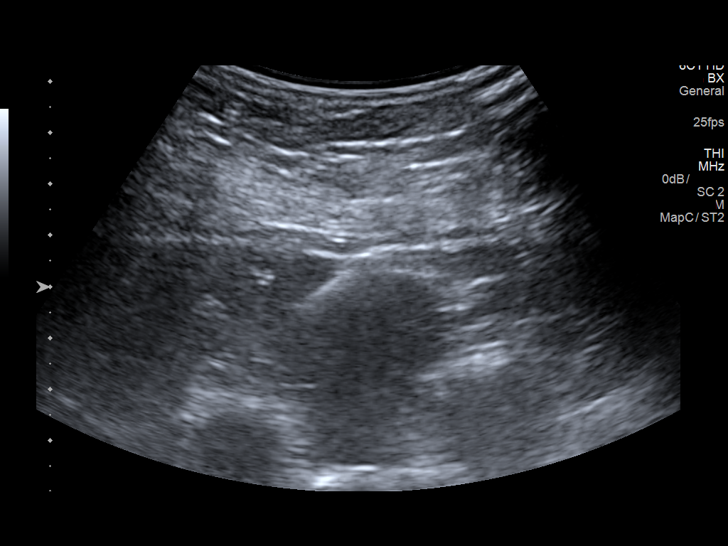
[im 7/12]
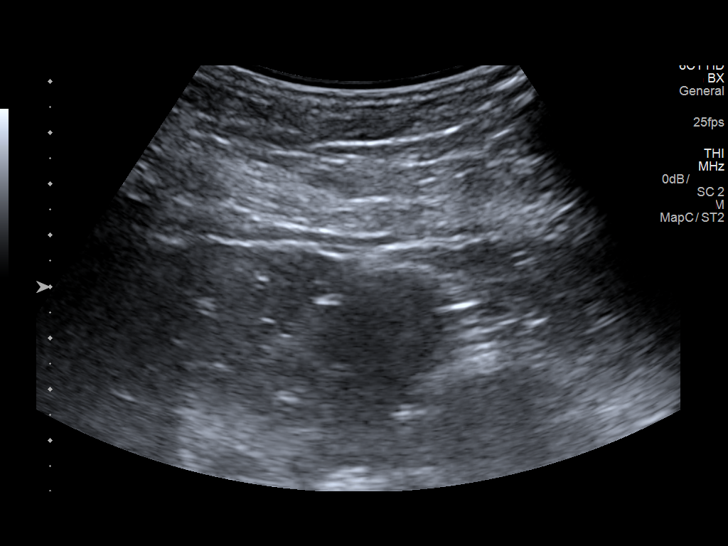
[im 8/12]
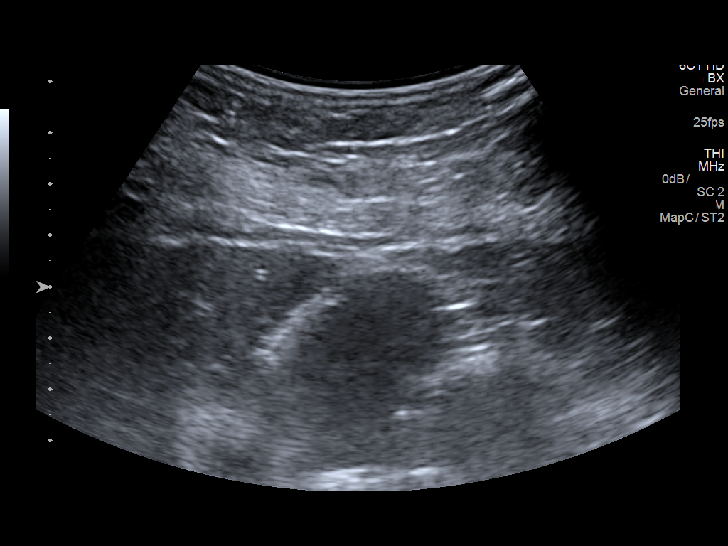
[im 9/12]
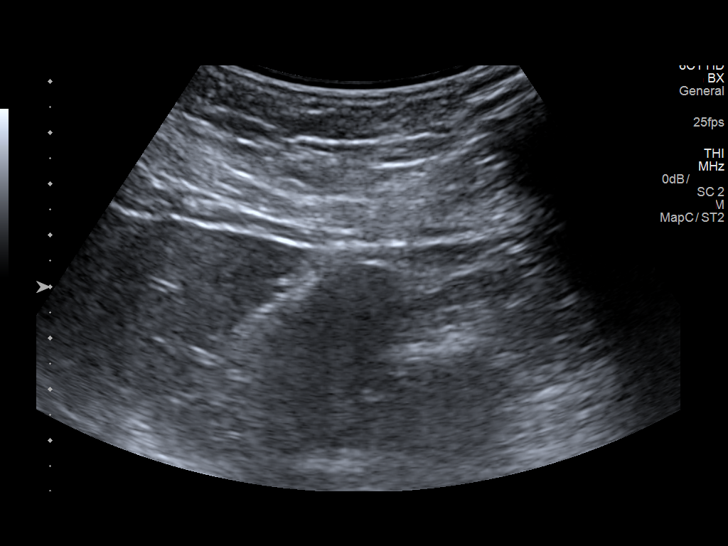
[im 10/12]
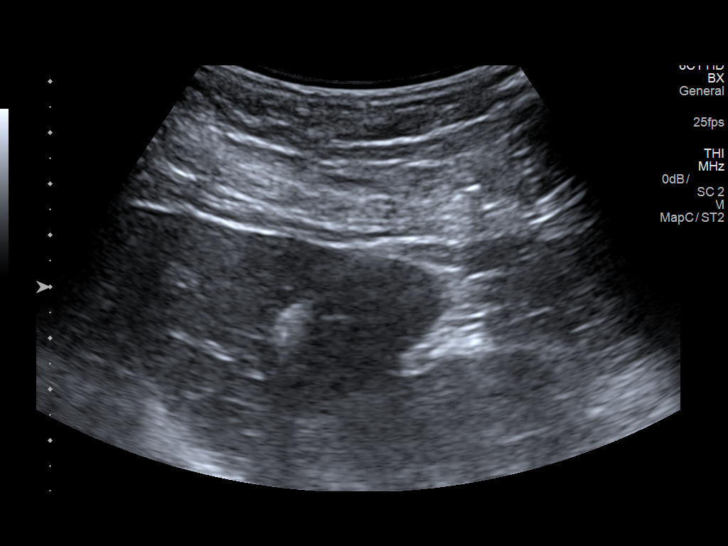
[im 11/12]
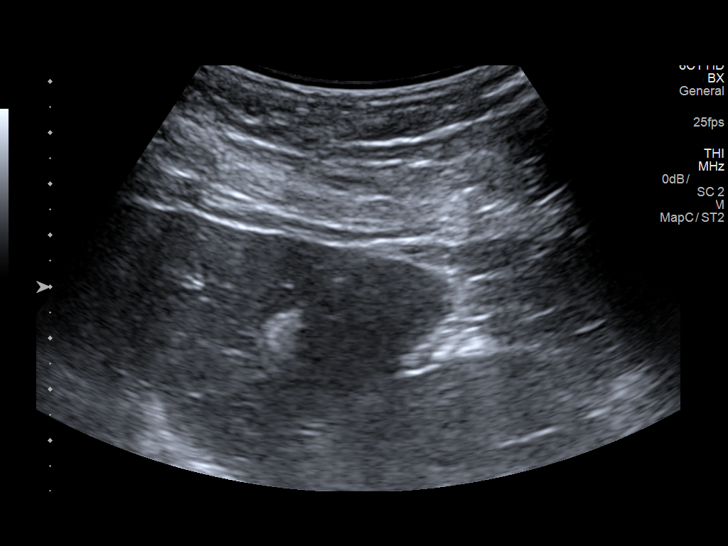
[im 12/12]
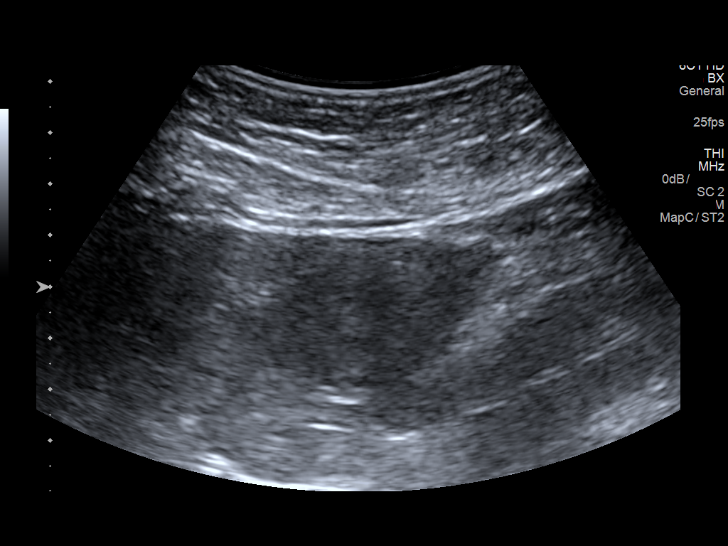

[12 of 12 positions shown; findings below may reference images not displayed]

Survey ultrasound of the liver was performed, a representative
lesion in the medial left segment localized, and an appropriate skin
entry site was determined. Skin site was marked, prepped with
chlorhexidine, and draped in usual sterile fashion, and infiltrated
locally with 1% lidocaine.

Intravenous Fentanyl and Versed were administered as conscious
sedation during continuous monitoring of the patient's level of
consciousness and physiological / cardiorespiratory status by the
radiology RN, with a total moderate sedation time of 13 minutes. A
17 gauge trocar needle was advanced under ultrasound guidance into
the liver. 3 coaxial 80gauge core samples were then obtained through
the guide needle. The guide needle was removed. Post procedure scans
demonstrate no apparent complication.

COMPLICATIONS:
COMPLICATIONS
None immediate
FINDINGS: Multiple hypoechoic liver lesions were localized. Representative
core biopsy samples obtained as above.
IMPRESSION: 1. Technically successful ultrasound guided core liver lesion
biopsy.
